# Patient Record
Sex: Male | Born: 1985
Health system: Southern US, Community
[De-identification: ages and names within clinical notes are randomized; demographics above are authoritative.]

## PROBLEM LIST (undated history)

## (undated) DIAGNOSIS — R7301 Impaired fasting glucose: Secondary | ICD-10-CM

## (undated) DIAGNOSIS — G4733 Obstructive sleep apnea (adult) (pediatric): Secondary | ICD-10-CM

## (undated) DIAGNOSIS — E785 Hyperlipidemia, unspecified: Secondary | ICD-10-CM

## (undated) DIAGNOSIS — G40909 Epilepsy, unspecified, not intractable, without status epilepticus: Secondary | ICD-10-CM

## (undated) DIAGNOSIS — J342 Deviated nasal septum: Secondary | ICD-10-CM

## (undated) DIAGNOSIS — K219 Gastro-esophageal reflux disease without esophagitis: Secondary | ICD-10-CM

## (undated) DIAGNOSIS — G43109 Migraine with aura, not intractable, without status migrainosus: Secondary | ICD-10-CM

## (undated) DIAGNOSIS — Z9989 Dependence on other enabling machines and devices: Secondary | ICD-10-CM

## (undated) HISTORY — DX: Obstructive sleep apnea (adult) (pediatric): G47.33

## (undated) HISTORY — DX: Migraine with aura, not intractable, without status migrainosus: G43.109

## (undated) HISTORY — DX: Gastro-esophageal reflux disease without esophagitis: K21.9

## (undated) HISTORY — PX: TONSILLECTOMY: SUR1361

## (undated) HISTORY — DX: Hyperlipidemia, unspecified: E78.5

## (undated) HISTORY — DX: Deviated nasal septum: J34.2

## (undated) HISTORY — DX: Impaired fasting glucose: R73.01

## (undated) HISTORY — DX: Dependence on other enabling machines and devices: Z99.89

## (undated) HISTORY — DX: Epilepsy, unspecified, not intractable, without status epilepticus: G40.909

---

## 2014-05-21 ENCOUNTER — Telehealth: Payer: Self-pay | Admitting: Cardiology

## 2014-05-21 NOTE — Telephone Encounter (Signed)
[facility] for appointment on 05/27/14 with Dr Jens Somrenshaw.  [facility] ([facility]) for Dr Ludwig Clarksrenshaw's schedule on 05/27/14. lp

## 2014-05-27 ENCOUNTER — Encounter: Payer: Self-pay | Admitting: *Deleted

## 2014-05-27 ENCOUNTER — Ambulatory Visit (INDEPENDENT_AMBULATORY_CARE_PROVIDER_SITE_OTHER): Payer: BLUE CROSS/BLUE SHIELD | Admitting: Cardiology

## 2014-05-27 ENCOUNTER — Encounter: Payer: Self-pay | Admitting: Cardiology

## 2014-05-27 VITALS — BP 116/90 | HR 83 | Ht 73.0 in | Wt 262.0 lb

## 2014-05-27 DIAGNOSIS — R0600 Dyspnea, unspecified: Secondary | ICD-10-CM | POA: Insufficient documentation

## 2014-05-27 DIAGNOSIS — R079 Chest pain, unspecified: Secondary | ICD-10-CM

## 2014-05-27 DIAGNOSIS — R072 Precordial pain: Secondary | ICD-10-CM

## 2014-05-27 NOTE — Progress Notes (Signed)
     HPI: 29 year old male for evaluation of chest pain. Laboratories January 2016 showed normal potassium, renal function, liver functions. D-dimer negative. Hemoglobin 16.5. Troponin negative. Chest x-ray negative. Patient states he has had intermittent chest pain for approximately 6-8 months. It is in various locations on his chest. It is described as an ache. No radiation. Not pleuritic, positional, exertional or related to food. Resolves spontaneously. Notes some dyspnea on exertion as well. No orthopnea or PND. No pedal edema or syncope.  Current Outpatient Prescriptions  Medication Sig Dispense Refill  . Biotin 1 MG CAPS Take 1,000 mcg by mouth daily.     . fluticasone (FLONASE) 50 MCG/ACT nasal spray Place 2 sprays into both nostrils daily.     . Magnesium Sulfate 70 MG CAPS Take 1,200 mcg by mouth daily.     . Omega-3 Fatty Acids (FISH OIL) 1000 MG CAPS Take 1 capsule by mouth daily. Takes two to three tablets daily.    Marland Kitchen. topiramate (TOPAMAX) 25 MG capsule Take 25 mg by mouth 2 (two) times daily. Takes 6 tablets daily.     No current facility-administered medications for this visit.    No Known Allergies   Past Medical History  Diagnosis Date  . GERD (gastroesophageal reflux disease)   . Seizure disorder   . OSA (obstructive sleep apnea)     Past Surgical History  Procedure Laterality Date  . Tonsillectomy      History   Social History  . Marital Status: Married    Spouse Name: N/A  . Number of Children: N/A  . 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 Smoking status: Never Smoker   . Smokeless tobacco: Not on file  . Alcohol Use: 0.0 oz/week    0 Standard drinks or equivalent per week     Comment: Occasional  . Drug Use: No  . Sexual Activity: Not on file   Other Topics Concern  . Not on file   Social History Narrative  . No narrative on file    Family History  Problem Relation Age of Onset  .  Hypothyroidism Mother   . Breast cancer Mother     ROS: no fevers or chills, productive cough, hemoptysis, dysphasia, odynophagia, melena, hematochezia, dysuria, hematuria, rash, seizure activity, orthopnea, PND, pedal edema, claudication. Remaining systems are negative.  Physical Exam:   Blood pressure 116/90, pulse 83, height 6\' 1"  (1.854 m), weight 262 lb (118.842 kg).  General:  Well developed/well nourished in NAD Skin warm/dry Patient not depressed No peripheral clubbing Back-normal HEENT-normal/normal eyelids Neck supple/normal carotid upstroke bilaterally; no bruits; no JVD; no thyromegaly chest - CTA/ normal expansion CV - RRR/normal S1 and S2; no murmurs, rubs or gallops;  PMI nondisplaced Abdomen -NT/ND, no HSM, no mass, + bowel sounds, no bruit 2+ femoral pulses, no bruits Ext-no edema, chords, 2+ DP Neuro-grossly nonfocal  ECG sinus rhythm at a rate of 83. No ST changes.

## 2014-05-27 NOTE — Assessment & Plan Note (Signed)
Patient notes dyspnea on exertion. Schedule echocardiogram to assess LV function.

## 2014-05-27 NOTE — Patient Instructions (Signed)
Your physician recommends that you schedule a follow-up appointment in: AS NEEDED PENDING TEST RESULTS  Your physician has requested that you have an echocardiogram. Echocardiography is a painless test that uses sound waves to create images of your heart. It provides your doctor with information about the size and shape of your heart and how well your heart's chambers and valves are working. This procedure takes approximately one hour. There are no restrictions for this procedure.   Your physician has requested that you have an exercise tolerance test. For further information please visit [email or web address]. Please also follow instruction sheet, as given.    Exercise Stress Electrocardiogram An exercise stress electrocardiogram is a test to check how blood flows to your heart. It is done to find areas of poor blood flow. You will need to walk on a treadmill for this test. The electrocardiogram will record your heartbeat when you are at rest and when you are exercising. BEFORE THE PROCEDURE  Do not have drinks with caffeine or foods with caffeine for 24 hours before the test, or as told by your doctor. This includes coffee, tea (even decaf tea), sodas, chocolate, and cocoa.  Follow your doctor's instructions about eating and drinking before the test.  Ask your doctor what medicines you should or should not take before the test. Take your medicines with water unless told by your doctor not to.  If you use an inhaler, bring it with you to the test.  Bring a snack to eat after the test.  Do not  smoke for 4 hours before the test.  Do not put lotions, powders, creams, or oils on your chest before the test.  Wear comfortable shoes and clothing. PROCEDURE  You will have patches put on your chest. Small areas of your chest may need to be shaved. Wires will be connected to the patches.  Your heart rate will be watched while you are resting and while you are exercising.  You will walk on the  treadmill. The treadmill will slowly get faster to raise your heart rate.  The test will take about 1-2 hours. AFTER THE PROCEDURE  Your heart rate and blood pressure will be watched after the test.  You may return to your normal diet, activities, and medicines or as told by your doctor. Document Released: 09/13/2007 Document Revised: 08/11/2013 Document Reviewed: 12/02/2012 ExitCare Patient Information 2015 [employer]. This information is not intended to replace advice given to you by your health care provider. Make sure you discuss any questions you have with your health care provider.   

## 2014-05-27 NOTE — Assessment & Plan Note (Signed)
Symptoms very atypical. However he is concerned about his symptoms and we will therefore proceed with an exercise treadmill for risk stratification.

## 2014-06-19 ENCOUNTER — Telehealth (HOSPITAL_COMMUNITY): Payer: Self-pay

## 2014-06-19 NOTE — Telephone Encounter (Signed)
Encounter complete. 

## 2014-06-23 ENCOUNTER — Telehealth (HOSPITAL_COMMUNITY): Payer: Self-pay

## 2014-06-23 NOTE — Telephone Encounter (Signed)
Encounter complete. 

## 2014-06-24 ENCOUNTER — Ambulatory Visit (HOSPITAL_BASED_OUTPATIENT_CLINIC_OR_DEPARTMENT_OTHER)
Admission: RE | Admit: 2014-06-24 | Discharge: 2014-06-24 | Disposition: A | Discharge status: Home / Self Care | Payer: BLUE CROSS/BLUE SHIELD | Source: Ambulatory Visit | Attending: Cardiology | Admitting: Cardiology

## 2014-06-24 ENCOUNTER — Ambulatory Visit (HOSPITAL_COMMUNITY)
Admission: RE | Admit: 2014-06-24 | Discharge: 2014-06-24 | Disposition: A | Discharge status: Home / Self Care | Payer: BLUE CROSS/BLUE SHIELD | Source: Ambulatory Visit | Attending: Cardiology | Admitting: Cardiology

## 2014-06-24 DIAGNOSIS — R079 Chest pain, unspecified: Secondary | ICD-10-CM | POA: Diagnosis present

## 2014-06-24 NOTE — Procedures (Signed)
Exercise Treadmill Test   Test  Exercise Tolerance Test Ordering MD: Olga MillersBrian Crenshaw, MD  Interpreting MD:   Unique Test No: 1 Treadmill:  1  Indication for ETT: chest pain - rule out ischemia  Contraindication to ETT: No   Stress Modality: exercise - treadmill  Cardiac Imaging Performed: non   Protocol: standard Bruce - maximal  Max BP:  177/84  Max MPHR (bpm):191 85% MPR (bpm):  162  MPHR obtained (bpm):  176 % MPHR obtained:  92  Reached 85% MPHR (min:sec):  8:00 Total Exercise Time (min-sec):  9:42  Workload in METS:  11.10 Borg Scale:   Reason ETT Terminated:  fatigue, dyspnea, light-headedness    ST Segment Analysis At Rest: normal ST segments - no evidence of significant ST depression With Exercise: upsloping <771mm st depression  Other Information Arrhythmia:  No Angina during ETT:  absent (0) Quality of ETT:  diagnostic  ETT Interpretation:  normal - no evidence of ischemia by ST analysis  Comments: Excellent exercise tolerance <1 mm upsloping ST depression No chest pain. Normal BP Response  Chrystie NoseKenneth C. Rahn Lacuesta, MD, [facility] Attending Cardiologist [facility]

## 2014-06-24 NOTE — Progress Notes (Signed)
2D Echocardiogram Complete.  06/24/2014   Goldy Calandra, RDCS  

## 2014-10-21 DIAGNOSIS — E785 Hyperlipidemia, unspecified: Secondary | ICD-10-CM

## 2014-10-21 DIAGNOSIS — R7301 Impaired fasting glucose: Secondary | ICD-10-CM

## 2014-10-21 HISTORY — DX: Hyperlipidemia, unspecified: E78.5

## 2014-10-21 HISTORY — DX: Impaired fasting glucose: R73.01

## 2015-07-22 DIAGNOSIS — G4719 Other hypersomnia: Secondary | ICD-10-CM | POA: Insufficient documentation

## 2015-07-22 DIAGNOSIS — G4733 Obstructive sleep apnea (adult) (pediatric): Secondary | ICD-10-CM

## 2015-07-22 DIAGNOSIS — Z9989 Dependence on other enabling machines and devices: Secondary | ICD-10-CM

## 2015-07-22 DIAGNOSIS — R0683 Snoring: Secondary | ICD-10-CM | POA: Insufficient documentation

## 2015-07-22 DIAGNOSIS — E6609 Other obesity due to excess calories: Secondary | ICD-10-CM | POA: Insufficient documentation

## 2015-07-22 HISTORY — DX: Obstructive sleep apnea (adult) (pediatric): G47.33

## 2015-10-13 NOTE — Progress Notes (Signed)
      HPI: FU chest pain. Laboratories January 2016 showed normal potassium, renal function, liver functions. D-dimer negative. Hemoglobin 16.5. Troponin negative. Chest x-ray negative. Echocardiogram March 2016 showed normal LV systolic function. Exercise treadmill March 2016 was negative. Patient seen recently with chest pain.Chest x-ray negative. Troponin normal. Hemoglobin 15.7. Potassium 4.1. TSH 1.06. Since last seen, He does not typically have dyspnea on exertion, orthopnea, PND, pedal edema, syncope or chest pain. He recently had 2 episodes of palpitations that he was seen in the emergency room for. They're described as sudden onset of heart racing followed by abrupt cessation with dizziness. One occurred after steroid injection.  Current Outpatient Prescriptions  Medication Sig Dispense Refill  . Biotin 1 MG CAPS Take 1,000 mcg by mouth daily.     . fluticasone (FLONASE) 50 MCG/ACT nasal spray Place 2 sprays into both nostrils daily.     . Magnesium Sulfate 70 MG CAPS Take 1,200 mcg by mouth daily.     . Omega-3 Fatty Acids (FISH OIL) 1000 MG CAPS Take 1 capsule by mouth daily. Takes two to three tablets daily.    Marland Kitchen. topiramate (TOPAMAX) 25 MG capsule Take 25 mg by mouth 2 (two) times daily. Takes 6 tablets daily.     No current facility-administered medications for this visit.     Past Medical History  Diagnosis Date  . GERD (gastroesophageal reflux disease)   . Seizure disorder   . OSA (obstructive sleep apnea)     Past Surgical History  Procedure Laterality Date  . Tonsillectomy      Social History   Social History  . Marital Status: Married    Spouse Name: N/A  . Number of Children: N/A  . 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 Smoking status: Never Smoker   . Smokeless tobacco: Not on file  . Alcohol Use: 0.0 oz/week    0 Standard drinks or equivalent per week     Comment: Occasional  . Drug Use: No  .  Sexual Activity: Not on file   Other Topics Concern  . Not on file   Social History Narrative  . No narrative on file    Family History  Problem Relation Age of Onset  . Hypothyroidism Mother   . Breast cancer Mother     ROS: no fevers or chills, productive cough, hemoptysis, dysphasia, odynophagia, melena, hematochezia, dysuria, hematuria, rash, seizure activity, orthopnea, PND, pedal edema, claudication. Remaining systems are negative.  Physical Exam: Well-developed well-nourished in no acute distress.  Skin is warm and dry.  HEENT is normal.  Neck is supple.  Chest is clear to auscultation with normal expansion.  Cardiovascular exam is regular rate and rhythm.  Abdominal exam nontender or distended. No masses palpated. Extremities show no edema. neuro grossly intact  ECG Sinus rhythm at a rate of 60. Nonspecific ST changes.  A/P  1 Palpitations-etiology unclear. Symptoms have been relatively infrequent. If he has more frequent episodes we will place a monitor to exclude significant arrhythmia. Approximately 20 minutes spent reviewing outside records prior to visit. 2 chest pain-no further symptoms. Previous treadmill negative. 3 dyspnea-some dyspnea following recent episodes of palpitations. Echocardiogram showed normal LV function previously. No plans for further evaluation at this point unless symptoms recur.  Olga MillersBrian Donaldson Richter, MD

## 2015-10-15 ENCOUNTER — Telehealth: Payer: Self-pay | Admitting: Cardiology

## 2015-10-15 NOTE — Telephone Encounter (Signed)
[facility] for appointment on 10/18/15 with Dr Jens Somrenshaw.  [facility] for Dr Ludwig Clarksrenshaw's schedule on 10/18/15. lp

## 2015-10-18 ENCOUNTER — Ambulatory Visit (INDEPENDENT_AMBULATORY_CARE_PROVIDER_SITE_OTHER): Payer: BLUE CROSS/BLUE SHIELD | Admitting: Cardiology

## 2015-10-18 ENCOUNTER — Encounter: Payer: Self-pay | Admitting: Cardiology

## 2015-10-18 VITALS — BP 114/76 | HR 60 | Ht 73.0 in | Wt 266.0 lb

## 2015-10-18 DIAGNOSIS — R0600 Dyspnea, unspecified: Secondary | ICD-10-CM | POA: Diagnosis not present

## 2015-10-18 DIAGNOSIS — R072 Precordial pain: Secondary | ICD-10-CM

## 2015-10-18 DIAGNOSIS — R002 Palpitations: Secondary | ICD-10-CM | POA: Diagnosis not present

## 2015-10-18 NOTE — Patient Instructions (Signed)
Your physician wants you to follow-up in: 6 months in [place] with Dr. Jens Somrenshaw. You will receive a reminder letter in the mail two months in advance. If you don't receive a letter, please call our office to schedule the follow-up appointment.   If you need a refill on your cardiac medications before your next appointment, please call your pharmacy.

## 2015-10-21 DIAGNOSIS — Z79899 Other long term (current) drug therapy: Secondary | ICD-10-CM

## 2015-10-21 DIAGNOSIS — Z5181 Encounter for therapeutic drug level monitoring: Secondary | ICD-10-CM | POA: Insufficient documentation

## 2015-10-21 DIAGNOSIS — G40909 Epilepsy, unspecified, not intractable, without status epilepticus: Secondary | ICD-10-CM | POA: Insufficient documentation

## 2015-10-21 DIAGNOSIS — G43109 Migraine with aura, not intractable, without status migrainosus: Secondary | ICD-10-CM

## 2015-10-21 HISTORY — DX: Migraine with aura, not intractable, without status migrainosus: G43.109

## 2016-01-19 DIAGNOSIS — M542 Cervicalgia: Secondary | ICD-10-CM | POA: Insufficient documentation

## 2016-03-20 DIAGNOSIS — G4733 Obstructive sleep apnea (adult) (pediatric): Secondary | ICD-10-CM | POA: Diagnosis not present

## 2016-04-20 DIAGNOSIS — M542 Cervicalgia: Secondary | ICD-10-CM | POA: Diagnosis not present

## 2016-04-20 DIAGNOSIS — G40909 Epilepsy, unspecified, not intractable, without status epilepticus: Secondary | ICD-10-CM | POA: Diagnosis not present

## 2016-04-20 DIAGNOSIS — G43109 Migraine with aura, not intractable, without status migrainosus: Secondary | ICD-10-CM | POA: Diagnosis not present

## 2016-04-20 DIAGNOSIS — Z79899 Other long term (current) drug therapy: Secondary | ICD-10-CM | POA: Diagnosis not present

## 2016-04-20 DIAGNOSIS — Z5181 Encounter for therapeutic drug level monitoring: Secondary | ICD-10-CM | POA: Diagnosis not present

## 2016-05-10 DIAGNOSIS — R293 Abnormal posture: Secondary | ICD-10-CM | POA: Diagnosis not present

## 2016-05-10 DIAGNOSIS — R51 Headache: Secondary | ICD-10-CM | POA: Diagnosis not present

## 2016-05-10 DIAGNOSIS — M542 Cervicalgia: Secondary | ICD-10-CM | POA: Diagnosis not present

## 2016-05-16 DIAGNOSIS — R293 Abnormal posture: Secondary | ICD-10-CM | POA: Diagnosis not present

## 2016-05-16 DIAGNOSIS — R51 Headache: Secondary | ICD-10-CM | POA: Diagnosis not present

## 2016-05-16 DIAGNOSIS — M542 Cervicalgia: Secondary | ICD-10-CM | POA: Diagnosis not present

## 2016-05-25 DIAGNOSIS — M542 Cervicalgia: Secondary | ICD-10-CM | POA: Diagnosis not present

## 2016-05-25 DIAGNOSIS — R51 Headache: Secondary | ICD-10-CM | POA: Diagnosis not present

## 2016-05-25 DIAGNOSIS — R293 Abnormal posture: Secondary | ICD-10-CM | POA: Diagnosis not present

## 2016-06-05 DIAGNOSIS — R293 Abnormal posture: Secondary | ICD-10-CM | POA: Diagnosis not present

## 2016-06-05 DIAGNOSIS — R51 Headache: Secondary | ICD-10-CM | POA: Diagnosis not present

## 2016-06-05 DIAGNOSIS — M542 Cervicalgia: Secondary | ICD-10-CM | POA: Diagnosis not present

## 2016-06-28 DIAGNOSIS — H9202 Otalgia, left ear: Secondary | ICD-10-CM | POA: Diagnosis not present

## 2016-06-28 DIAGNOSIS — G4733 Obstructive sleep apnea (adult) (pediatric): Secondary | ICD-10-CM | POA: Diagnosis not present

## 2016-06-28 DIAGNOSIS — H66005 Acute suppurative otitis media without spontaneous rupture of ear drum, recurrent, left ear: Secondary | ICD-10-CM | POA: Diagnosis not present

## 2016-06-28 DIAGNOSIS — J342 Deviated nasal septum: Secondary | ICD-10-CM | POA: Diagnosis not present

## 2016-07-03 DIAGNOSIS — R293 Abnormal posture: Secondary | ICD-10-CM | POA: Diagnosis not present

## 2016-07-03 DIAGNOSIS — R51 Headache: Secondary | ICD-10-CM | POA: Diagnosis not present

## 2016-07-03 DIAGNOSIS — M542 Cervicalgia: Secondary | ICD-10-CM | POA: Diagnosis not present

## 2016-07-19 DIAGNOSIS — J343 Hypertrophy of nasal turbinates: Secondary | ICD-10-CM | POA: Diagnosis not present

## 2016-07-19 DIAGNOSIS — J342 Deviated nasal septum: Secondary | ICD-10-CM | POA: Diagnosis not present

## 2016-07-19 DIAGNOSIS — J329 Chronic sinusitis, unspecified: Secondary | ICD-10-CM | POA: Diagnosis not present

## 2016-07-19 DIAGNOSIS — J309 Allergic rhinitis, unspecified: Secondary | ICD-10-CM | POA: Diagnosis not present

## 2016-07-24 NOTE — Progress Notes (Deleted)
      HPI: FU chest pain. Laboratories January 2016 showed normal potassium, renal function, liver functions. D-dimer negative. Hemoglobin 16.5. Troponin negative. Chest x-ray negative. Echocardiogram March 2016 showed normal LV systolic function. Exercise treadmill March 2016 was negative. Patient seen recently with chest pain.Chest x-ray negative. Troponin normal. Hemoglobin 15.7. Potassium 4.1. TSH 1.06. Since last seen,   Current Outpatient Prescriptions  Medication Sig Dispense Refill  . fluticasone (FLONASE) 50 MCG/ACT nasal spray Place 2 sprays into both nostrils daily.     . Magnesium Sulfate 70 MG CAPS Take 1,200 mcg by mouth daily.     . Omega-3 Fatty Acids (FISH OIL) 1000 MG CAPS Take 1 capsule by mouth daily. Takes two to three tablets daily.    Marland Kitchen topiramate (TOPAMAX) 25 MG capsule Take 25 mg by mouth 2 (two) times daily. Takes 6 tablets daily.     No current facility-administered medications for this visit.      Past Medical History:  Diagnosis Date  . GERD (gastroesophageal reflux disease)   . OSA (obstructive sleep apnea)   . Seizure disorder [facility])     Past Surgical History:  Procedure Laterality Date  . TONSILLECTOMY      Social History   Social History  . Marital status: Married    Spouse name: N/A  . Number of children: N/A  . 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 Smoking status: Never Smoker  . Smokeless tobacco: Not on file  . Alcohol use 0.0 oz/week     Comment: Occasional  . Drug use: No  . Sexual activity: Not on file   Other Topics Concern  . Not on file   Social History Narrative  . No narrative on file    Family History  Problem Relation Age of Onset  . Hypothyroidism Mother   . Breast cancer Mother     ROS: no fevers or chills, productive cough, hemoptysis, dysphasia, odynophagia, melena, hematochezia, dysuria, hematuria, rash, seizure activity, orthopnea, PND, pedal edema,  claudication. Remaining systems are negative.  Physical Exam: Well-developed well-nourished in no acute distress.  Skin is warm and dry.  HEENT is normal.  Neck is supple.  Chest is clear to auscultation with normal expansion.  Cardiovascular exam is regular rate and rhythm.  Abdominal exam nontender or distended. No masses palpated. Extremities show no edema. neuro grossly intact  ECG- personally reviewed  A/P  1  Olga Millers, MD

## 2016-07-28 DIAGNOSIS — G4733 Obstructive sleep apnea (adult) (pediatric): Secondary | ICD-10-CM | POA: Diagnosis not present

## 2016-08-02 ENCOUNTER — Ambulatory Visit: Payer: BLUE CROSS/BLUE SHIELD | Admitting: Cardiology

## 2016-08-07 DIAGNOSIS — M542 Cervicalgia: Secondary | ICD-10-CM | POA: Diagnosis not present

## 2016-08-07 DIAGNOSIS — G43109 Migraine with aura, not intractable, without status migrainosus: Secondary | ICD-10-CM | POA: Diagnosis not present

## 2016-08-07 DIAGNOSIS — G40909 Epilepsy, unspecified, not intractable, without status epilepticus: Secondary | ICD-10-CM | POA: Diagnosis not present

## 2016-08-07 DIAGNOSIS — Z5181 Encounter for therapeutic drug level monitoring: Secondary | ICD-10-CM | POA: Diagnosis not present

## 2016-08-16 NOTE — Progress Notes (Deleted)
      HPI: FU chest pain. Echocardiogram March 2016 showed normal LV systolic function. Exercise treadmill March 2016 was negative. Since last seen,   Current Outpatient Prescriptions  Medication Sig Dispense Refill  . fluticasone (FLONASE) 50 MCG/ACT nasal spray Place 2 sprays into both nostrils daily.     . Magnesium Sulfate 70 MG CAPS Take 1,200 mcg by mouth daily.     . Omega-3 Fatty Acids (FISH OIL) 1000 MG CAPS Take 1 capsule by mouth daily. Takes two to three tablets daily.    Marland Kitchen. topiramate (TOPAMAX) 25 MG capsule Take 25 mg by mouth 2 (two) times daily. Takes 6 tablets daily.     No current facility-administered medications for this visit.      Past Medical History:  Diagnosis Date  . GERD (gastroesophageal reflux disease)   . OSA (obstructive sleep apnea)   . Seizure disorder [facility](HCC)     Past Surgical History:  Procedure Laterality Date  . TONSILLECTOMY      Social History   Social History  . Marital status: Married    Spouse name: N/A  . Number of children: N/A  . 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 Smoking status: Never Smoker  . Smokeless tobacco: Not on file  . Alcohol use 0.0 oz/week     Comment: Occasional  . Drug use: No  . Sexual activity: Not on file   Other Topics Concern  . Not on file   Social History Narrative  . No narrative on file    Family History  Problem Relation Age of Onset  . Hypothyroidism Mother   . Breast cancer Mother     ROS: no fevers or chills, productive cough, hemoptysis, dysphasia, odynophagia, melena, hematochezia, dysuria, hematuria, rash, seizure activity, orthopnea, PND, pedal edema, claudication. Remaining systems are negative.  Physical Exam: Well-developed well-nourished in no acute distress.  Skin is warm and dry.  HEENT is normal.  Neck is supple.  Chest is clear to auscultation with normal expansion.  Cardiovascular exam is regular rate and  rhythm.  Abdominal exam nontender or distended. No masses palpated. Extremities show no edema. neuro grossly intact  ECG- personally reviewed  A/P  1  Benjamin MillersBrian Ryane Canavan, MD

## 2016-08-23 ENCOUNTER — Ambulatory Visit: Payer: BLUE CROSS/BLUE SHIELD | Admitting: Cardiology

## 2016-09-08 DIAGNOSIS — J301 Allergic rhinitis due to pollen: Secondary | ICD-10-CM | POA: Diagnosis not present

## 2016-09-08 DIAGNOSIS — J328 Other chronic sinusitis: Secondary | ICD-10-CM | POA: Diagnosis not present

## 2016-10-26 NOTE — Progress Notes (Deleted)
      HPI: FU chest pain. Echocardiogram March 2016 showed normal LV systolic function. Exercise treadmill March 2016 was negative. Since last seen,   Current Outpatient Prescriptions  Medication Sig Dispense Refill  . fluticasone (FLONASE) 50 MCG/ACT nasal spray Place 2 sprays into both nostrils daily.     . Magnesium Sulfate 70 MG CAPS Take 1,200 mcg by mouth daily.     . Omega-3 Fatty Acids (FISH OIL) 1000 MG CAPS Take 1 capsule by mouth daily. Takes two to three tablets daily.    Marland Kitchen. topiramate (TOPAMAX) 25 MG capsule Take 25 mg by mouth 2 (two) times daily. Takes 6 tablets daily.     No current facility-administered medications for this visit.      Past Medical History:  Diagnosis Date  . GERD (gastroesophageal reflux disease)   . OSA (obstructive sleep apnea)   . Seizure disorder [facility](HCC)     Past Surgical History:  Procedure Laterality Date  . TONSILLECTOMY      Social History   Social History  . Marital status: Married    Spouse name: N/A  . Number of children: N/A  . 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 Smoking status: Never Smoker  . Smokeless tobacco: Not on file  . Alcohol use 0.0 oz/week     Comment: Occasional  . Drug use: No  . Sexual activity: Not on file   Other Topics Concern  . Not on file   Social History Narrative  . No narrative on file    Family History  Problem Relation Age of Onset  . Hypothyroidism Mother   . Breast cancer Mother     ROS: no fevers or chills, productive cough, hemoptysis, dysphasia, odynophagia, melena, hematochezia, dysuria, hematuria, rash, seizure activity, orthopnea, PND, pedal edema, claudication. Remaining systems are negative.  Physical Exam: Well-developed well-nourished in no acute distress.  Skin is warm and dry.  HEENT is normal.  Neck is supple.  Chest is clear to auscultation with normal expansion.  Cardiovascular exam is regular rate and  rhythm.  Abdominal exam nontender or distended. No masses palpated. Extremities show no edema. neuro grossly intact  ECG- personally reviewed  A/P  1  Olga MillersBrian Mio Schellinger, MD

## 2016-11-08 ENCOUNTER — Ambulatory Visit: Payer: BLUE CROSS/BLUE SHIELD | Admitting: Cardiology

## 2016-12-01 DIAGNOSIS — D582 Other hemoglobinopathies: Secondary | ICD-10-CM | POA: Diagnosis not present

## 2016-12-01 DIAGNOSIS — Z0000 Encounter for general adult medical examination without abnormal findings: Secondary | ICD-10-CM | POA: Diagnosis not present

## 2016-12-01 DIAGNOSIS — E785 Hyperlipidemia, unspecified: Secondary | ICD-10-CM | POA: Diagnosis not present

## 2016-12-01 DIAGNOSIS — R7301 Impaired fasting glucose: Secondary | ICD-10-CM | POA: Diagnosis not present

## 2017-01-26 DIAGNOSIS — M542 Cervicalgia: Secondary | ICD-10-CM | POA: Diagnosis not present

## 2017-01-26 DIAGNOSIS — M545 Low back pain: Secondary | ICD-10-CM | POA: Diagnosis not present

## 2017-02-13 NOTE — Progress Notes (Deleted)
      HPI: FU chest pain. Echocardiogram March 2016 showed normal LV systolic function. Exercise treadmill March 2016 was negative. Since last seen,   Current Outpatient Medications  Medication Sig Dispense Refill  . fluticasone (FLONASE) 50 MCG/ACT nasal spray Place 2 sprays into both nostrils daily.     . Magnesium Sulfate 70 MG CAPS Take 1,200 mcg by mouth daily.     . Omega-3 Fatty Acids (FISH OIL) 1000 MG CAPS Take 1 capsule by mouth daily. Takes two to three tablets daily.    Marland Kitchen. topiramate (TOPAMAX) 25 MG capsule Take 25 mg by mouth 2 (two) times daily. Takes 6 tablets daily.     No current facility-administered medications for this visit.      Past Medical History:  Diagnosis Date  . GERD (gastroesophageal reflux disease)   . OSA (obstructive sleep apnea)   . Seizure disorder [facility](HCC)     Past Surgical History:  Procedure Laterality Date  . TONSILLECTOMY      Social History   Socioeconomic History  . Marital status: Married    Spouse name: Not on file  . Number of children: Not on file  . Years of education: Not on file  . Highest education level: Not on file  Social Needs  . Financial resource strain: Not on file  . Food insecurity - worry: Not on file  . Food insecurity - inability: Not on file  . Transportation needs - medical: Not on file  . Transportation needs - non-medical: Not on file  Occupational History    Comment: IT  Tobacco Use  . Smoking status: Never Smoker  Substance and Sexual Activity  . Alcohol use: Yes    Alcohol/week: 0.0 oz    Comment: Occasional  . Drug use: No  . Sexual activity: Not on file  Other Topics Concern  . Not on file  Social History Narrative  . Not on file    Family History  Problem Relation Age of Onset  . Hypothyroidism Mother   . Breast cancer Mother     ROS: no fevers or chills, productive cough, hemoptysis, dysphasia, odynophagia, melena, hematochezia, dysuria, hematuria, rash, seizure activity, orthopnea,  PND, pedal edema, claudication. Remaining systems are negative.  Physical Exam: Well-developed well-nourished in no acute distress.  Skin is warm and dry.  HEENT is normal.  Neck is supple.  Chest is clear to auscultation with normal expansion.  Cardiovascular exam is regular rate and rhythm.  Abdominal exam nontender or distended. No masses palpated. Extremities show no edema. neuro grossly intact  ECG- personally reviewed  A/P  1  Benjamin MillersBrian Crenshaw, MD

## 2017-02-21 ENCOUNTER — Ambulatory Visit: Payer: BLUE CROSS/BLUE SHIELD | Admitting: Cardiology

## 2017-04-06 DIAGNOSIS — G4733 Obstructive sleep apnea (adult) (pediatric): Secondary | ICD-10-CM | POA: Diagnosis not present

## 2017-05-07 DIAGNOSIS — J019 Acute sinusitis, unspecified: Secondary | ICD-10-CM | POA: Diagnosis not present

## 2017-05-08 NOTE — Progress Notes (Signed)
      HPI: FU chest pain. Echocardiogram March 2016 showed normal LV systolic function. Exercise treadmill March 2016 was negative. Since last seen, the patient has dyspnea with more extreme activities but not with routine activities. It is relieved with rest. It is not associated with chest pain. There is no orthopnea, PND or pedal edema. There is no syncope. There is no exertional chest pain. Occasional palpitations.   Current Outpatient Medications  Medication Sig Dispense Refill  . fluticasone (FLONASE) 50 MCG/ACT nasal spray Place 2 sprays into both nostrils daily.     . Magnesium Sulfate 70 MG CAPS Take 1,200 mcg by mouth daily.     . Omega-3 Fatty Acids (FISH OIL) 1000 MG CAPS Take 1 capsule by mouth daily. Takes two to three tablets daily.    Marland Kitchen. topiramate (TOPAMAX) 25 MG capsule Take 25 mg by mouth 2 (two) times daily. Takes 6 tablets daily.     No current facility-administered medications for this visit.      Past Medical History:  Diagnosis Date  . GERD (gastroesophageal reflux disease)   . OSA (obstructive sleep apnea)   . Seizure disorder [facility](HCC)     Past Surgical History:  Procedure Laterality Date  . TONSILLECTOMY      Social History   Socioeconomic History  . Marital status: Married    Spouse name: Not on file  . Number of children: Not on file  . Years of education: Not on file  . Highest education level: Not on file  Social Needs  . Financial resource strain: Not on file  . Food insecurity - worry: Not on file  . Food insecurity - inability: Not on file  . Transportation needs - medical: Not on file  . Transportation needs - non-medical: Not on file  Occupational History    Comment: IT  Tobacco Use  . Smoking status: Never Smoker  . Smokeless tobacco: Never Used  Substance and Sexual Activity  . Alcohol use: Yes    Alcohol/week: 0.0 oz    Comment: Occasional  . Drug use: No  . Sexual activity: Not on file  Other Topics Concern  . Not on file    Social History Narrative  . Not on file    Family History  Problem Relation Age of Onset  . Hypothyroidism Mother   . Breast cancer Mother     ROS: no fevers or chills, productive cough, hemoptysis, dysphasia, odynophagia, melena, hematochezia, dysuria, hematuria, rash, seizure activity, orthopnea, PND, pedal edema, claudication. Remaining systems are negative.  Physical Exam: Well-developed well-nourished in no acute distress.  Skin is warm and dry.  HEENT is normal.  Neck is supple.  Chest is clear to auscultation with normal expansion.  Cardiovascular exam is regular rate and rhythm.  Abdominal exam nontender or distended. No masses palpated. Extremities show no edema. neuro grossly intact  ECG-sinus rhythm at a rate of 66.  Nonspecific ST changes.  Personally reviewed  A/P  1 chest pain-symptoms atypical previously; no recurrence.  Exercise treadmill negative.  We will not pursue further cardiac evaluation.  2 palpitations-symptoms are reasonably well controlled.  We will consider a monitor in the future if they worsen.  3 dyspnea-previous echocardiogram showed normal LV function.  No further evaluation.  Olga MillersBrian Jaquavius Hudler, MD

## 2017-05-16 ENCOUNTER — Encounter: Payer: Self-pay | Admitting: Cardiology

## 2017-05-16 ENCOUNTER — Ambulatory Visit (INDEPENDENT_AMBULATORY_CARE_PROVIDER_SITE_OTHER): Payer: BLUE CROSS/BLUE SHIELD | Admitting: Cardiology

## 2017-05-16 VITALS — BP 118/82 | HR 66 | Ht 73.0 in | Wt 258.1 lb

## 2017-05-16 DIAGNOSIS — R0600 Dyspnea, unspecified: Secondary | ICD-10-CM | POA: Diagnosis not present

## 2017-05-16 DIAGNOSIS — R072 Precordial pain: Secondary | ICD-10-CM

## 2017-05-16 DIAGNOSIS — R002 Palpitations: Secondary | ICD-10-CM | POA: Diagnosis not present

## 2017-05-16 NOTE — Patient Instructions (Signed)
Your physician wants you to follow-up in: ONE YEAR WITH DR CRENSHAW You will receive a reminder letter in the mail two months in advance. If you don't receive a letter, please call our office to schedule the follow-up appointment.   If you need a refill on your cardiac medications before your next appointment, please call your pharmacy.  

## 2017-05-24 DIAGNOSIS — R6889 Other general symptoms and signs: Secondary | ICD-10-CM | POA: Diagnosis not present

## 2017-05-24 DIAGNOSIS — J329 Chronic sinusitis, unspecified: Secondary | ICD-10-CM | POA: Diagnosis not present

## 2017-10-10 ENCOUNTER — Ambulatory Visit: Payer: BLUE CROSS/BLUE SHIELD | Admitting: Physician Assistant

## 2017-10-17 ENCOUNTER — Encounter: Payer: Self-pay | Admitting: Physician Assistant

## 2017-10-17 ENCOUNTER — Ambulatory Visit: Payer: BLUE CROSS/BLUE SHIELD | Admitting: Physician Assistant

## 2017-10-17 ENCOUNTER — Encounter (INDEPENDENT_AMBULATORY_CARE_PROVIDER_SITE_OTHER): Payer: Self-pay

## 2017-10-17 VITALS — BP 120/86 | HR 77 | Ht 73.0 in | Wt 256.0 lb

## 2017-10-17 DIAGNOSIS — E6609 Other obesity due to excess calories: Secondary | ICD-10-CM

## 2017-10-17 DIAGNOSIS — R1032 Left lower quadrant pain: Secondary | ICD-10-CM | POA: Diagnosis not present

## 2017-10-17 DIAGNOSIS — Z7689 Persons encountering health services in other specified circumstances: Secondary | ICD-10-CM

## 2017-10-17 DIAGNOSIS — E785 Hyperlipidemia, unspecified: Secondary | ICD-10-CM

## 2017-10-17 DIAGNOSIS — J342 Deviated nasal septum: Secondary | ICD-10-CM

## 2017-10-17 MED ORDER — DICYCLOMINE HCL 10 MG PO CAPS: 10.0000 mg | ORAL_CAPSULE | Freq: Three times a day (TID) | ORAL | 0 refills | Status: DC

## 2017-10-17 NOTE — Patient Instructions (Signed)
Diet for Irritable Bowel Syndrome When you have irritable bowel syndrome (IBS), the foods you eat and your eating habits are very important. IBS may cause various symptoms, such as abdominal pain, constipation, or diarrhea. Choosing the right foods can help ease discomfort caused by these symptoms. Work with your health care provider and dietitian to find the best eating plan to help control your symptoms. What general guidelines do I need to follow?  Keep a food diary. This will help you identify foods that cause symptoms. Write down: ? What you eat and when. ? What symptoms you have. ? When symptoms occur in relation to your meals.  Avoid foods that cause symptoms. Talk with your dietitian about other ways to get the same nutrients that are in these foods.  Eat more foods that contain fiber. Take a fiber supplement if directed by your dietitian.  Eat your meals slowly, in a relaxed setting.  Aim to eat 5-6 small meals per day. Do not skip meals.  Drink enough fluids to keep your urine clear or pale yellow.  Ask your health care provider if you should take an over-the-counter probiotic during flare-ups to help restore healthy gut bacteria.  If you have cramping or diarrhea, try making your meals low in fat and high in carbohydrates. Examples of carbohydrates are pasta, rice, whole grain breads and cereals, fruits, and vegetables.  If dairy products cause your symptoms to flare up, try eating less of them. You might be able to handle yogurt better than other dairy products because it contains bacteria that help with digestion. What foods are not recommended? The following are some foods and drinks that may worsen your symptoms:  Fatty foods, such as French fries.  Milk products, such as cheese or ice cream.  Chocolate.  Alcohol.  Products with caffeine, such as coffee.  Carbonated drinks, such as soda.  The items listed above may not be a complete list of foods and beverages to  avoid. Contact your dietitian for more information. What foods are good sources of fiber? Your health care provider or dietitian may recommend that you eat more foods that contain fiber. Fiber can help reduce constipation and other IBS symptoms. Add foods with fiber to your diet a little at a time so that your body can get used to them. Too much fiber at once might cause gas and swelling of your abdomen. The following are some foods that are good sources of fiber:  Apples.  Peaches.  Pears.  Berries.  Figs.  Broccoli (raw).  Cabbage.  Carrots.  Raw peas.  Kidney beans.  Lima beans.  Whole grain bread.  Whole grain cereal.  Where to find more information: International Foundation for Functional Gastrointestinal Disorders: www.iffgd.org National Institute of Diabetes and Digestive and Kidney Diseases: www.niddk.nih.gov/health-information/health-topics/digestive-diseases/ibs/Pages/facts.aspx This information is not intended to replace advice given to you by your health care provider. Make sure you discuss any questions you have with your health care provider. Document Released: 06/17/2003 Document Revised: 09/02/2015 Document Reviewed: 06/27/2013 Elsevier Interactive Patient Education  2018 Elsevier Inc.  

## 2017-10-17 NOTE — Progress Notes (Signed)
HPI:                                                                Benjamin Mendoza is a 32 y.o. male who [facility] Primary Care Sports Medicine today to establish care  Current concerns: abdominal pain  Abdominal Pain  This is a recurrent problem. The current episode started more than 1 month ago (x 6 months). The problem occurs daily. The problem has been unchanged. The pain is located in the LLQ. The quality of the pain is colicky and sharp. The abdominal pain does not radiate. Associated symptoms include constipation, flatus and nausea. Pertinent negatives include no diarrhea, dysuria, hematochezia, hematuria, melena or vomiting. Associated symptoms comments: + bloating. The pain is aggravated by eating (+ stress, greasy foods). The pain is relieved by nothing. Treatments tried: probiotic, dietary changes. The treatment provided moderate relief. There is no history of abdominal surgery.     Depression screen PHQ 2/9 10/17/2017  Decreased Interest 0  Down, Depressed, Hopeless 1  PHQ - 2 Score 1  Altered sleeping 0  Tired, decreased energy 1  Change in appetite 0  Feeling bad or failure about yourself  0  Trouble concentrating 0  Moving slowly or fidgety/restless 0  Suicidal thoughts 0  PHQ-9 Score 2  Difficult doing work/chores Not difficult at all    GAD 7 : Generalized Anxiety Score 10/17/2017  Nervous, Anxious, on Edge 1  Control/stop worrying 0  Worry too much - different things 1  Trouble relaxing 2  Restless 1  Easily annoyed or irritable 2  Afraid - awful might happen 0  Total GAD 7 Score 7      Past Medical History:  Diagnosis Date  . Deviated nasal septum   . Dyslipidemia 10/21/2014  . GERD (gastroesophageal reflux disease)   . IFG (impaired fasting glucose) 10/21/2014  . Migraine with visual aura 10/21/2015  . OSA (obstructive sleep apnea)   . OSA on CPAP 07/22/2015   Overview:  Followed [facility]  .  Seizure disorder [facility](HCC)    Past Surgical History:  Procedure Laterality Date  . TONSILLECTOMY     Social History   Tobacco Use  . Smoking status: Never Smoker  . Smokeless tobacco: Never Used  Substance Use Topics  . Alcohol use: Yes    Alcohol/week: 2.4 oz    Types: 4 Standard drinks or equivalent per week   family history includes Breast cancer in his mother; Hypothyroidism in his mother.    ROS: Review of Systems  HENT: Positive for sinus pain.   Gastrointestinal: Positive for abdominal pain, constipation, flatus and nausea. Negative for diarrhea, hematochezia, melena and vomiting.  Genitourinary: Negative for dysuria and hematuria.  Musculoskeletal: Positive for joint pain and neck pain.  Skin:       + new lumps     Medications: Current Outpatient Medications  Medication Sig Dispense Refill  . Bromelains (BROMELAIN PO) Take by mouth.    . Magnesium Sulfate 70 MG CAPS Take 1,200 mcg by mouth daily.     . Omega-3 Fatty Acids (FISH OIL) 1000 MG CAPS Take 1 capsule by mouth daily. Takes two to three tablets daily.    . Probiotic Product (PROBIOTIC ADVANCED) CAPS Take by  mouth.    . dicyclomine (BENTYL) 10 MG capsule Take 1 capsule (10 mg total) by mouth 4 (four) times daily -  before meals and at bedtime. 90 capsule 0  . topiramate (TOPAMAX) 50 MG tablet Take 200 mg by mouth at bedtime.  3   No current facility-administered medications for this visit.    Allergies  Allergen Reactions  . Metoclopramide Palpitations            Objective:  BP 120/86   Pulse 77   Ht 6\' 1"  (1.854 m)   Wt 256 lb (116.1 kg)   SpO2 94%   BMI 33.78 kg/m  Gen:  alert, not ill-appearing, no distress, appropriate for age, obese male HEENT: head normocephalic without obvious abnormality, conjunctiva and cornea clear, moderate amount of cerumen in bilateral ear canals, TM appears intact, deviated nasal septum and right nasal turbinate is edematous, neck supple, trachea midline Pulm:  Normal work of breathing, normal phonation, clear to auscultation bilaterally, no wheezes, rales or rhonchi CV: Normal rate, regular rhythm, s1 and s2 distinct, no murmurs, clicks or rubs  GI: abdomen soft, non-tender, no guarding or rigidity, no palpable masses Neuro: alert and oriented x 3, no tremor MSK: extremities atraumatic, normal gait and station Skin: intact, no rashes on exposed skin, no jaundice, no cyanosis Psych: well-groomed, cooperative, good eye contact, euthymic mood, affect mood-congruent, speech is articulate, and thought processes clear and goal-directed    No results found for this or any previous visit (from the past 72 hour(s)). No results found.    Assessment and Plan: 32 y.o. male with   Encounter to establish care  Colicky LLQ abdominal pain - Plan: Urinalysis, Routine w reflex microscopic, US Abdomen Complete, CBC with Differential/Platelet, Lipase, Comprehensive metabolic panel, Urine Culture, dicyclomine (BENTYL) 10 MG capsule  Dyslipidemia - Plan: Lipid Panel w/reflex Direct LDL  Class 1 obesity due to excess calories in adult, unspecified BMI, unspecified whether serious comorbidity present  Deviated nasal septum  - Personally reviewed PMH, PSH, PFH, medications, allergies, HM - Age-appropriate cancer screening: n/a - Influenza n/a - Tdap UTD - PHQ2 negative  Colicky LLQ - 6 months of persistent LLQ abdominal pain. Benign exam.  Symptoms are worsened by fatty/fried foods. Assessing for biliary tree pathology with [place] and CMP. Differential is broad so also checking UA, urine culture, lipase and CBC. No red flag symptoms (weight loss, constitutional symptoms, melena/hematochezia). Functional abdominal pain is also on the differential. Encouraged IBS diet and trial of Dicyclomine   Patient education and anticipatory guidance given Patient agrees with treatment plan Follow-up in 2 weeks or sooner as needed if symptoms worsen or fail to  improve  Levonne Hubert PA-C

## 2017-10-26 DIAGNOSIS — R1032 Left lower quadrant pain: Secondary | ICD-10-CM | POA: Diagnosis not present

## 2017-10-26 DIAGNOSIS — J343 Hypertrophy of nasal turbinates: Secondary | ICD-10-CM | POA: Diagnosis not present

## 2017-10-26 DIAGNOSIS — J342 Deviated nasal septum: Secondary | ICD-10-CM | POA: Diagnosis not present

## 2017-10-26 DIAGNOSIS — J329 Chronic sinusitis, unspecified: Secondary | ICD-10-CM | POA: Diagnosis not present

## 2017-10-26 DIAGNOSIS — E785 Hyperlipidemia, unspecified: Secondary | ICD-10-CM | POA: Diagnosis not present

## 2017-10-26 DIAGNOSIS — R110 Nausea: Secondary | ICD-10-CM | POA: Diagnosis not present

## 2017-10-27 LAB — CBC WITH DIFFERENTIAL/PLATELET
BASOS PCT: 0.4 %
Basophils Absolute: 27 cells/uL (ref 0–200)
Eosinophils Absolute: 107 cells/uL (ref 15–500)
Eosinophils Relative: 1.6 %
HEMATOCRIT: 47.6 % (ref 38.5–50.0)
Hemoglobin: 16.1 g/dL (ref 13.2–17.1)
Lymphs Abs: 3142 cells/uL (ref 850–3900)
MCH: 30.7 pg (ref 27.0–33.0)
MCHC: 33.8 g/dL (ref 32.0–36.0)
MCV: 90.7 fL (ref 80.0–100.0)
MPV: 10.2 fL (ref 7.5–12.5)
Monocytes Relative: 7.9 %
NEUTROS ABS: 2894 {cells}/uL (ref 1500–7800)
Neutrophils Relative %: 43.2 %
Platelets: 247 10*3/uL (ref 140–400)
RBC: 5.25 10*6/uL (ref 4.20–5.80)
RDW: 12.3 % (ref 11.0–15.0)
Total Lymphocyte: 46.9 %
WBC: 6.7 10*3/uL (ref 3.8–10.8)
WBCMIX: 529 {cells}/uL (ref 200–950)

## 2017-10-27 LAB — COMPREHENSIVE METABOLIC PANEL
AG Ratio: 1.9 (calc) (ref 1.0–2.5)
ALT: 19 U/L (ref 9–46)
AST: 15 U/L (ref 10–40)
Albumin: 4.6 g/dL (ref 3.6–5.1)
Alkaline phosphatase (APISO): 37 U/L — ABNORMAL LOW (ref 40–115)
BILIRUBIN TOTAL: 0.6 mg/dL (ref 0.2–1.2)
BUN: 17 mg/dL (ref 7–25)
CO2: 24 mmol/L (ref 20–32)
Calcium: 9.6 mg/dL (ref 8.6–10.3)
Chloride: 109 mmol/L (ref 98–110)
Creat: 1.19 mg/dL (ref 0.60–1.35)
Globulin: 2.4 g/dL (calc) (ref 1.9–3.7)
Glucose, Bld: 98 mg/dL (ref 65–139)
Potassium: 4.1 mmol/L (ref 3.5–5.3)
SODIUM: 142 mmol/L (ref 135–146)
Total Protein: 7 g/dL (ref 6.1–8.1)

## 2017-10-27 LAB — URINE CULTURE
MICRO NUMBER: 90859213
Result:: NO GROWTH
SPECIMEN QUALITY:: ADEQUATE

## 2017-10-27 LAB — URINALYSIS, ROUTINE W REFLEX MICROSCOPIC
Bilirubin Urine: NEGATIVE
Glucose, UA: NEGATIVE
Hgb urine dipstick: NEGATIVE
KETONES UR: NEGATIVE
Leukocytes, UA: NEGATIVE
NITRITE: NEGATIVE
Protein, ur: NEGATIVE
SPECIFIC GRAVITY, URINE: 1.01 (ref 1.001–1.03)
pH: 6.5 (ref 5.0–8.0)

## 2017-10-27 LAB — LIPID PANEL W/REFLEX DIRECT LDL
CHOL/HDL RATIO: 3.9 (calc) (ref <5.0)
Cholesterol: 189 mg/dL (ref <200)
HDL: 48 mg/dL (ref >40)
LDL CHOLESTEROL (CALC): 106 mg/dL — AB
Non-HDL Cholesterol (Calc): 141 mg/dL (calc) — ABNORMAL HIGH (ref <130)
Triglycerides: 233 mg/dL — ABNORMAL HIGH (ref <150)

## 2017-10-27 LAB — LIPASE: Lipase: 38 U/L (ref 7–60)

## 2017-10-30 ENCOUNTER — Encounter: Payer: Self-pay | Admitting: Physician Assistant

## 2017-10-30 DIAGNOSIS — E781 Pure hyperglyceridemia: Secondary | ICD-10-CM | POA: Insufficient documentation

## 2017-10-30 NOTE — Progress Notes (Signed)
No lab abnormalities to explain abdominal pain Triglycerides are a little increased. Recommend low-fat Mediterranean diet, regular aerobic exercise and losing 5% of current body weight Still recommend abdominal ultrasound Continue trial of Bentyl and follow-up

## 2017-10-31 ENCOUNTER — Ambulatory Visit: Payer: BLUE CROSS/BLUE SHIELD | Admitting: Physician Assistant

## 2017-11-26 ENCOUNTER — Other Ambulatory Visit: Payer: Self-pay | Admitting: Physician Assistant

## 2017-11-26 DIAGNOSIS — R1032 Left lower quadrant pain: Secondary | ICD-10-CM

## 2017-12-04 DIAGNOSIS — J320 Chronic maxillary sinusitis: Secondary | ICD-10-CM | POA: Diagnosis not present

## 2017-12-04 DIAGNOSIS — J343 Hypertrophy of nasal turbinates: Secondary | ICD-10-CM | POA: Diagnosis not present

## 2018-01-23 DIAGNOSIS — J069 Acute upper respiratory infection, unspecified: Secondary | ICD-10-CM | POA: Diagnosis not present

## 2018-02-12 DIAGNOSIS — S238XXA Sprain of other specified parts of thorax, initial encounter: Secondary | ICD-10-CM | POA: Diagnosis not present

## 2018-02-12 DIAGNOSIS — S161XXA Strain of muscle, fascia and tendon at neck level, initial encounter: Secondary | ICD-10-CM | POA: Diagnosis not present

## 2018-02-12 DIAGNOSIS — S134XXA Sprain of ligaments of cervical spine, initial encounter: Secondary | ICD-10-CM | POA: Diagnosis not present

## 2018-02-12 DIAGNOSIS — M9901 Segmental and somatic dysfunction of cervical region: Secondary | ICD-10-CM | POA: Diagnosis not present

## 2018-03-11 DIAGNOSIS — G4733 Obstructive sleep apnea (adult) (pediatric): Secondary | ICD-10-CM | POA: Diagnosis not present

## 2019-01-06 DIAGNOSIS — J0110 Acute frontal sinusitis, unspecified: Secondary | ICD-10-CM | POA: Diagnosis not present

## 2019-01-14 DIAGNOSIS — M9905 Segmental and somatic dysfunction of pelvic region: Secondary | ICD-10-CM | POA: Diagnosis not present

## 2019-01-14 DIAGNOSIS — S39012A Strain of muscle, fascia and tendon of lower back, initial encounter: Secondary | ICD-10-CM | POA: Diagnosis not present

## 2019-01-14 DIAGNOSIS — M25551 Pain in right hip: Secondary | ICD-10-CM | POA: Diagnosis not present

## 2019-01-14 DIAGNOSIS — M9903 Segmental and somatic dysfunction of lumbar region: Secondary | ICD-10-CM | POA: Diagnosis not present

## 2019-01-23 DIAGNOSIS — Z9989 Dependence on other enabling machines and devices: Secondary | ICD-10-CM | POA: Diagnosis not present

## 2019-01-23 DIAGNOSIS — J0191 Acute recurrent sinusitis, unspecified: Secondary | ICD-10-CM | POA: Diagnosis not present

## 2019-01-23 DIAGNOSIS — G4733 Obstructive sleep apnea (adult) (pediatric): Secondary | ICD-10-CM | POA: Diagnosis not present

## 2019-02-05 DIAGNOSIS — J329 Chronic sinusitis, unspecified: Secondary | ICD-10-CM | POA: Diagnosis not present

## 2019-02-05 DIAGNOSIS — H6123 Impacted cerumen, bilateral: Secondary | ICD-10-CM | POA: Diagnosis not present

## 2019-03-25 ENCOUNTER — Telehealth: Payer: Self-pay | Admitting: Physician Assistant

## 2019-03-25 NOTE — Telephone Encounter (Signed)
Patient called in and was having some cold sensation in his left foot and felt like a charley horse in his calf for about a week. He has a follow up with Dr. Darene Lamer on 03/27/2019. Patient was advised if he had some pain to go to UC or ED if symptoms and he did not have any questions.

## 2019-03-27 ENCOUNTER — Other Ambulatory Visit: Payer: Self-pay

## 2019-03-27 ENCOUNTER — Ambulatory Visit (INDEPENDENT_AMBULATORY_CARE_PROVIDER_SITE_OTHER): Payer: BC Managed Care – PPO | Admitting: Sports Medicine

## 2019-03-27 ENCOUNTER — Ambulatory Visit (INDEPENDENT_AMBULATORY_CARE_PROVIDER_SITE_OTHER): Payer: BC Managed Care – PPO

## 2019-03-27 ENCOUNTER — Encounter: Payer: Self-pay | Admitting: Sports Medicine

## 2019-03-27 DIAGNOSIS — M5416 Radiculopathy, lumbar region: Secondary | ICD-10-CM

## 2019-03-27 DIAGNOSIS — M545 Low back pain: Secondary | ICD-10-CM | POA: Diagnosis not present

## 2019-03-27 MED ORDER — GABAPENTIN 300 MG PO CAPS: ORAL_CAPSULE | ORAL | 3 refills | Status: DC

## 2019-03-27 MED ORDER — PREDNISONE 50 MG PO TABS: ORAL_TABLET | ORAL | 0 refills | Status: DC

## 2019-03-27 NOTE — Assessment & Plan Note (Signed)
Left S1 distribution radiculitis, progressive weakness in the left leg, for this reason we are going to proceed with prednisone, x-rays, MRI, gabapentin at night, formal PT, and an MRI. Return to see me in 6 weeks.

## 2019-03-27 NOTE — Patient Instructions (Signed)
Lumbosacral Radiculopathy Lumbosacral radiculopathy is a condition that involves the spinal nerves and nerve roots in the low back and bottom of the spine. The condition develops when these nerves and nerve roots move out of place or become inflamed and cause symptoms. What are the causes? This condition may be caused by:  Pressure from a disk that bulges out of place (herniated disk). A disk is a plate of soft cartilage that separates bones in the spine.  Disk changes that occur with age (disk degeneration).  A narrowing of the bones of the lower back (spinal stenosis).  A tumor.  An infection.  An injury that places sudden pressure on the disks that cushion the bones of your lower spine. What increases the risk? You are more likely to develop this condition if:  You are a male who is 30-50 years old.  You are a male who is 50-60 years old.  You use improper technique when lifting things.  You are overweight or live a sedentary lifestyle.  Your work requires frequent lifting.  You smoke.  You do repetitive activities that strain the spine. What are the signs or symptoms? Symptoms of this condition include:  Pain that goes down from your back into your legs (sciatica), usually on one side of the body. This is the most common symptom. The pain may be worse with sitting, coughing, or sneezing.  Pain and numbness in your legs.  Muscle weakness.  Tingling.  Loss of bladder control or bowel control. How is this diagnosed? This condition may be diagnosed based on:  Your symptoms and medical history.  A physical exam. If the pain is lasting, you may have tests, such as:  MRI scan.  X-ray.  CT scan.  A type of X-ray used to examine the spinal canal after injecting a dye into your spine (myelogram).  A test to measure how electrical impulses move through a nerve (nerve conduction study). How is this treated? Treatment may depend on the cause of the condition and  may include:  Working with a physical therapist.  Taking pain medicine.  Applying heat and ice to affected areas.  Doing stretches to improve flexibility.  Doing exercises to strengthen back muscles.  Having chiropractic spinal manipulation.  Using transcutaneous electrical nerve stimulation (TENS) therapy.  Getting a steroid injection in the spine. In some cases, no treatment is needed. If the condition is long-lasting (chronic), or if symptoms are severe, treatment may involve surgery or lifestyle changes, such as following a weight-loss plan. Follow these instructions at home: Activity  Avoid bending and other activities that make the problem worse.  Maintain a proper position when standing or sitting: ? When standing, keep your upper back and neck straight, with your shoulders pulled back. Avoid slouching. ? When sitting, keep your back straight and relax your shoulders. Do not round your shoulders or pull them backward.  Do not sit or stand in one place for long periods of time.  Take brief periods of rest throughout the day. This will reduce your pain. It is usually better to rest by lying down or standing, not sitting.  When you are resting for longer periods, mix in some mild activity or stretching between periods of rest. This will help to prevent stiffness and pain.  Get regular exercise. Ask your health care provider what activities are safe for you. If you were shown how to do any exercises or stretches, do them as directed by your health care provider.  Do   not lift anything that is heavier than 10 lb (4.5 kg) or the limit that you are told by your health care provider. Always use proper lifting technique, which includes: ? Bending your knees. ? Keeping the load close to your body. ? Avoiding twisting. Managing pain  If directed, put ice on the affected area: ? Put ice in a plastic bag. ? Place a towel between your skin and the bag. ? Leave the ice on for 20  minutes, 2-3 times a day.  If directed, apply heat to the affected area as often as told by your health care provider. Use the heat source that your health care provider recommends, such as a moist heat pack or a heating pad. ? Place a towel between your skin and the heat source. ? Leave the heat on for 20-30 minutes. ? Remove the heat if your skin turns bright red. This is especially important if you are unable to feel pain, heat, or cold. You may have a greater risk of getting burned.  Take over-the-counter and prescription medicines only as told by your health care provider. General instructions  Sleep on a firm mattress in a comfortable position. Try lying on your side with your knees slightly bent. If you lie on your back, put a pillow under your knees.  Do not drive or use heavy machinery while taking prescription pain medicine.  If your health care provider prescribed a diet or exercise program, follow it as directed.  Keep all follow-up visits as told by your health care provider. This is important. Contact a health care provider if:  Your pain does not improve over time, even when taking pain medicines. Get help right away if:  You develop severe pain.  Your pain suddenly gets worse.  You develop increasing weakness in your legs.  You lose the ability to control your bladder or bowel.  You have difficulty walking or balancing.  You have a fever. Summary  Lumbosacral radiculopathy is a condition that occurs when the spinal nerves and nerve roots in the lower part of the spine move out of place or become inflamed and cause symptoms.  Symptoms include pain, numbness, and tingling that go down from your back into your legs (sciatica), muscle weakness, and loss of bladder control or bowel control.  If directed, apply ice or heat to the affected area as told by your health care provider.  Follow instructions about activity, rest, and proper lifting technique. This  information is not intended to replace advice given to you by your health care provider. Make sure you discuss any questions you have with your health care provider. Document Released: 03/27/2005 Document Revised: 03/15/2017 Document Reviewed: 03/15/2017 Elsevier Patient Education  2020 Elsevier Inc.  

## 2019-03-27 NOTE — Progress Notes (Signed)
Subjective:    I'm seeing this patient as a consultation for: Gena Fray, PA-C  CC: Left leg numbness and weakness  HPI: For months now this pleasant 33 year old male has had pain in his back with radiation down the left leg, numbness and tingling to the fourth and fifth toes, as well as mild to moderate weakness in the left leg.  Back pain is worse with sitting, flexion, Valsalva no bowel or bladder dysfunction, saddle numbness, or constitutional symptoms, no trauma.  I reviewed the past medical history, family history, social history, surgical history, and allergies today and no changes were needed.  Please see the problem list section below in epic for further details.  Past Medical History: Past Medical History:  Diagnosis Date  . Deviated nasal septum   . Dyslipidemia 10/21/2014  . GERD (gastroesophageal reflux disease)   . IFG (impaired fasting glucose) 10/21/2014  . Migraine with visual aura 10/21/2015  . OSA (obstructive sleep apnea)   . OSA on CPAP 07/22/2015   Overview:  Followed [facility]  . Seizure disorder [facility])    Past Surgical History: Past Surgical History:  Procedure Laterality Date  . TONSILLECTOMY     Social History: Social History   Socioeconomic History  . Marital status: Married    Spouse name: Not on file  . Number of children: Not on file  . Years of education: Not on file  . Highest education level: Not on file  Occupational History    Comment: IT  Tobacco Use  . Smoking status: Never Smoker  . Smokeless tobacco: Never Used  Substance and Sexual Activity  . Alcohol use: Yes    Alcohol/week: 4.0 standard drinks    Types: 4 Standard drinks or equivalent per week  . Drug use: Never  . Sexual activity: Yes    Birth control/protection: None  Other Topics Concern  . Not on file  Social History Narrative  . Not on file   Social Determinants of Health   Financial Resource Strain:   . Difficulty of Paying Living  Expenses: Not on file  Food Insecurity:   . Worried About Programme researcher, broadcasting/film/video in the Last Year: Not on file  . Ran Out of Food in the Last Year: Not on file  Transportation Needs:   . Lack of Transportation (Medical): Not on file  . Lack of Transportation (Non-Medical): Not on file  Physical Activity:   . Days of Exercise per Week: Not on file  . Minutes of Exercise per Session: Not on file  Stress:   . Feeling of Stress : Not on file  Social Connections:   . Frequency of Communication with Friends and Family: Not on file  . Frequency of Social Gatherings with Friends and Family: Not on file  . Attends Religious Services: Not on file  . Active Member of Clubs or Organizations: Not on file  . Attends Banker Meetings: Not on file  . Marital Status: Not on file   Family History: Family History  Problem Relation Age of Onset  . Hypothyroidism Mother   . Breast cancer Mother    Allergies: Allergies  Allergen Reactions  . Metoclopramide Palpitations        Medications: See med rec.  Review of Systems: No headache, visual changes, nausea, vomiting, diarrhea, constipation, dizziness, abdominal pain, skin rash, fevers, chills, night sweats, weight loss, swollen lymph nodes, body aches, joint swelling, muscle aches, chest pain, shortness of breath, mood changes, visual or  auditory hallucinations.   Objective:   General: Well Developed, well nourished, and in no acute distress.  Neuro:  Extra-ocular muscles intact, able to move all 4 extremities, sensation grossly intact.  Deep tendon reflexes tested were normal. Psych: Alert and oriented, mood congruent with affect. ENT:  Ears and nose appear unremarkable.  Hearing grossly normal. Neck: Unremarkable overall appearance, trachea midline.  No visible thyroid enlargement. Eyes: Conjunctivae and lids appear unremarkable.  Pupils equal and round. Skin: Warm and dry, no rashes noted.  Cardiovascular: Good palpable dorsalis  pedis and posterior tibial pulses., no extremity edema. Back Exam:  Inspection: Unremarkable  Motion: Flexion 45 deg, Extension 45 deg, Side Bending to 45 deg bilaterally,  Rotation to 45 deg bilaterally  SLR laying: Negative  XSLR laying: Negative  Palpable tenderness: None. FABER: negative. Sensory change: Gross sensation intact to all lumbar and sacral dermatomes.  Reflexes: 2+ at both patellar tendons, 2+ at the right Achilles, 0 at the left Achilles, Babinski's downgoing.  Strength at foot  Plantar-flexion: 5/5 Dorsi-flexion: 5/5 Eversion: 5/5 Inversion: 5/5  Leg strength  Quad: 5/5 Hamstring: 5/5 Hip flexor: 5/5 Hip abductors: 5/5  Gait unremarkable.  Impression and Recommendations:   This case required medical decision making of moderate complexity.  Left lumbar radiculitis Left S1 distribution radiculitis, progressive weakness in the left leg, for this reason we are going to proceed with prednisone, x-rays, MRI, gabapentin at night, formal PT, and an MRI. Return to see me in 6 weeks.   ___________________________________________ Gwen Her. Dianah Field, M.D., ABFM., CAQSM. Primary Care and Sports Medicine [facility]  Adjunct Professor of [employer] [facility] [employer]

## 2019-04-02 DIAGNOSIS — J342 Deviated nasal septum: Secondary | ICD-10-CM | POA: Diagnosis not present

## 2019-04-02 DIAGNOSIS — J329 Chronic sinusitis, unspecified: Secondary | ICD-10-CM | POA: Diagnosis not present

## 2019-04-02 DIAGNOSIS — H6980 Other specified disorders of Eustachian tube, unspecified ear: Secondary | ICD-10-CM | POA: Diagnosis not present

## 2019-04-07 DIAGNOSIS — M7611 Psoas tendinitis, right hip: Secondary | ICD-10-CM | POA: Diagnosis not present

## 2019-04-07 DIAGNOSIS — M9903 Segmental and somatic dysfunction of lumbar region: Secondary | ICD-10-CM | POA: Diagnosis not present

## 2019-04-07 DIAGNOSIS — M9901 Segmental and somatic dysfunction of cervical region: Secondary | ICD-10-CM | POA: Diagnosis not present

## 2019-04-07 DIAGNOSIS — M25551 Pain in right hip: Secondary | ICD-10-CM | POA: Diagnosis not present

## 2019-04-07 DIAGNOSIS — M9904 Segmental and somatic dysfunction of sacral region: Secondary | ICD-10-CM | POA: Diagnosis not present

## 2019-04-07 DIAGNOSIS — S39012A Strain of muscle, fascia and tendon of lower back, initial encounter: Secondary | ICD-10-CM | POA: Diagnosis not present

## 2019-04-07 DIAGNOSIS — M9906 Segmental and somatic dysfunction of lower extremity: Secondary | ICD-10-CM | POA: Diagnosis not present

## 2019-04-07 DIAGNOSIS — M9905 Segmental and somatic dysfunction of pelvic region: Secondary | ICD-10-CM | POA: Diagnosis not present

## 2019-04-09 DIAGNOSIS — M9903 Segmental and somatic dysfunction of lumbar region: Secondary | ICD-10-CM | POA: Diagnosis not present

## 2019-04-09 DIAGNOSIS — M9905 Segmental and somatic dysfunction of pelvic region: Secondary | ICD-10-CM | POA: Diagnosis not present

## 2019-04-09 DIAGNOSIS — M25551 Pain in right hip: Secondary | ICD-10-CM | POA: Diagnosis not present

## 2019-04-09 DIAGNOSIS — S39012A Strain of muscle, fascia and tendon of lower back, initial encounter: Secondary | ICD-10-CM | POA: Diagnosis not present

## 2019-04-14 DIAGNOSIS — M9905 Segmental and somatic dysfunction of pelvic region: Secondary | ICD-10-CM | POA: Diagnosis not present

## 2019-04-14 DIAGNOSIS — M25551 Pain in right hip: Secondary | ICD-10-CM | POA: Diagnosis not present

## 2019-04-14 DIAGNOSIS — M9903 Segmental and somatic dysfunction of lumbar region: Secondary | ICD-10-CM | POA: Diagnosis not present

## 2019-04-14 DIAGNOSIS — S39012A Strain of muscle, fascia and tendon of lower back, initial encounter: Secondary | ICD-10-CM | POA: Diagnosis not present

## 2019-04-18 DIAGNOSIS — J3489 Other specified disorders of nose and nasal sinuses: Secondary | ICD-10-CM | POA: Diagnosis not present

## 2019-04-18 DIAGNOSIS — J329 Chronic sinusitis, unspecified: Secondary | ICD-10-CM | POA: Diagnosis not present

## 2019-04-18 DIAGNOSIS — J343 Hypertrophy of nasal turbinates: Secondary | ICD-10-CM | POA: Diagnosis not present

## 2019-04-18 DIAGNOSIS — J342 Deviated nasal septum: Secondary | ICD-10-CM | POA: Diagnosis not present

## 2019-05-08 ENCOUNTER — Ambulatory Visit: Payer: BC Managed Care – PPO | Admitting: Sports Medicine

## 2019-05-11 ENCOUNTER — Ambulatory Visit (INDEPENDENT_AMBULATORY_CARE_PROVIDER_SITE_OTHER): Payer: BC Managed Care – PPO

## 2019-05-11 ENCOUNTER — Other Ambulatory Visit: Payer: Self-pay

## 2019-05-11 DIAGNOSIS — M5416 Radiculopathy, lumbar region: Secondary | ICD-10-CM | POA: Diagnosis not present

## 2019-05-11 DIAGNOSIS — M545 Low back pain: Secondary | ICD-10-CM | POA: Diagnosis not present

## 2019-05-12 DIAGNOSIS — M9903 Segmental and somatic dysfunction of lumbar region: Secondary | ICD-10-CM | POA: Diagnosis not present

## 2019-05-12 DIAGNOSIS — M9905 Segmental and somatic dysfunction of pelvic region: Secondary | ICD-10-CM | POA: Diagnosis not present

## 2019-05-12 DIAGNOSIS — S39012A Strain of muscle, fascia and tendon of lower back, initial encounter: Secondary | ICD-10-CM | POA: Diagnosis not present

## 2019-05-12 DIAGNOSIS — M25551 Pain in right hip: Secondary | ICD-10-CM | POA: Diagnosis not present

## 2019-05-29 ENCOUNTER — Ambulatory Visit: Payer: BC Managed Care – PPO | Admitting: Sports Medicine

## 2019-07-02 DIAGNOSIS — G40909 Epilepsy, unspecified, not intractable, without status epilepticus: Secondary | ICD-10-CM | POA: Diagnosis not present

## 2019-07-02 DIAGNOSIS — M542 Cervicalgia: Secondary | ICD-10-CM | POA: Diagnosis not present

## 2019-07-02 DIAGNOSIS — G43109 Migraine with aura, not intractable, without status migrainosus: Secondary | ICD-10-CM | POA: Diagnosis not present

## 2019-07-02 DIAGNOSIS — Z5181 Encounter for therapeutic drug level monitoring: Secondary | ICD-10-CM | POA: Diagnosis not present

## 2019-07-02 DIAGNOSIS — Z79899 Other long term (current) drug therapy: Secondary | ICD-10-CM | POA: Diagnosis not present

## 2019-07-04 DIAGNOSIS — M25551 Pain in right hip: Secondary | ICD-10-CM | POA: Diagnosis not present

## 2019-07-04 DIAGNOSIS — S39012A Strain of muscle, fascia and tendon of lower back, initial encounter: Secondary | ICD-10-CM | POA: Diagnosis not present

## 2019-07-04 DIAGNOSIS — M9903 Segmental and somatic dysfunction of lumbar region: Secondary | ICD-10-CM | POA: Diagnosis not present

## 2019-07-04 DIAGNOSIS — M9905 Segmental and somatic dysfunction of pelvic region: Secondary | ICD-10-CM | POA: Diagnosis not present

## 2019-08-18 DIAGNOSIS — M9903 Segmental and somatic dysfunction of lumbar region: Secondary | ICD-10-CM | POA: Diagnosis not present

## 2019-08-18 DIAGNOSIS — M25551 Pain in right hip: Secondary | ICD-10-CM | POA: Diagnosis not present

## 2019-08-18 DIAGNOSIS — M9905 Segmental and somatic dysfunction of pelvic region: Secondary | ICD-10-CM | POA: Diagnosis not present

## 2019-08-18 DIAGNOSIS — S39012A Strain of muscle, fascia and tendon of lower back, initial encounter: Secondary | ICD-10-CM | POA: Diagnosis not present

## 2020-10-04 IMAGING — MR MR LUMBAR SPINE W/O CM
4 of 5 series · 25 of 48 positions shown · non-contrast
Comparison: Lumbar radiographs 03/27/2019.

CLINICAL DATA: 33-year-old male with low back pain and progressive
left S1 radiculopathy and deficit. Posterior leg pain radiating from
the buttock to the foot with numbness in the toes for 6 months.

EXAM:
MRI LUMBAR SPINE WITHOUT CONTRAST
TECHNIQUE: Multiplanar, multisequence MR imaging of the lumbar spine was
performed. No intravenous contrast was administered.

[Series 4: T2 · sagittal · 4.0mm · 0.81mm/px · 6 of 17 slices shown (1 of 2)]
[im 1/17]
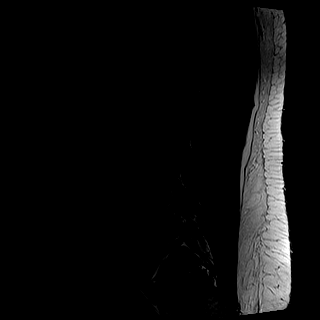
[im 4/17]
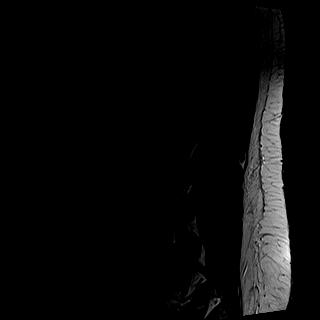
[im 7/17]
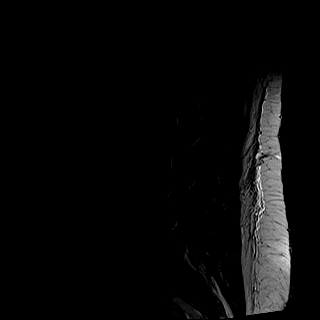
[im 10/17]
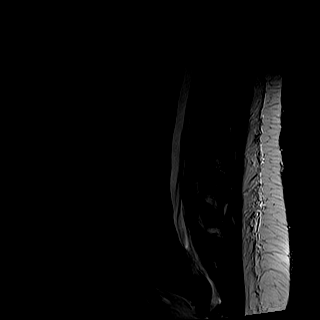
[im 13/17]
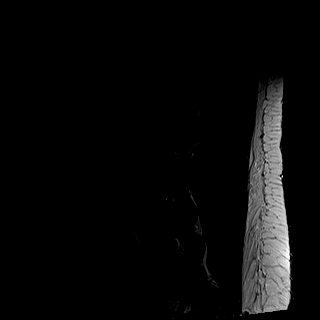
[im 17/17]
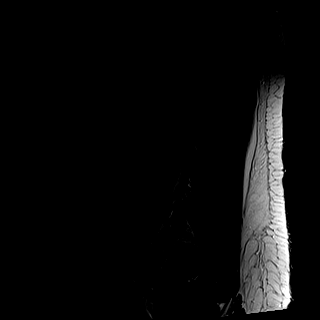

[Series 5: T1 · sagittal · 4.0mm · 0.41mm/px · 6 of 17 slices shown (1 of 2)]
[im 1/17]
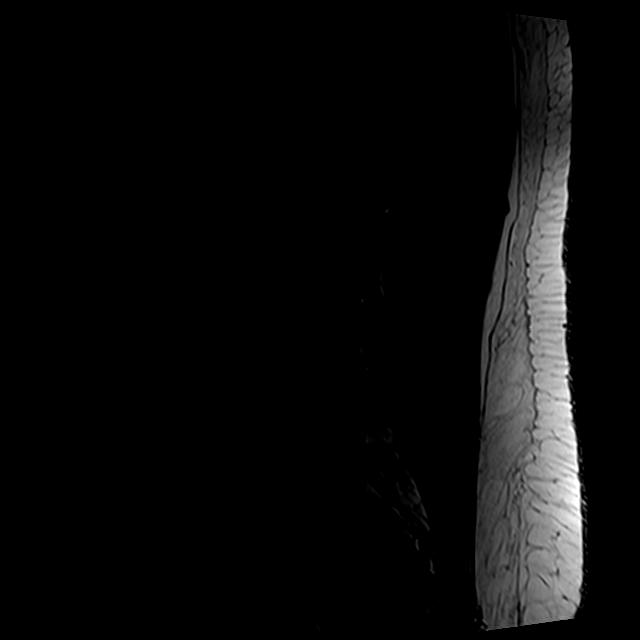
[im 4/17]
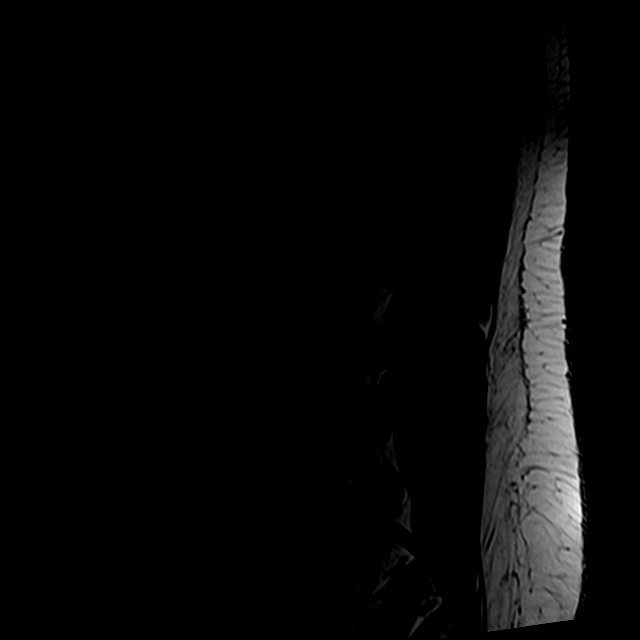
[im 7/17]
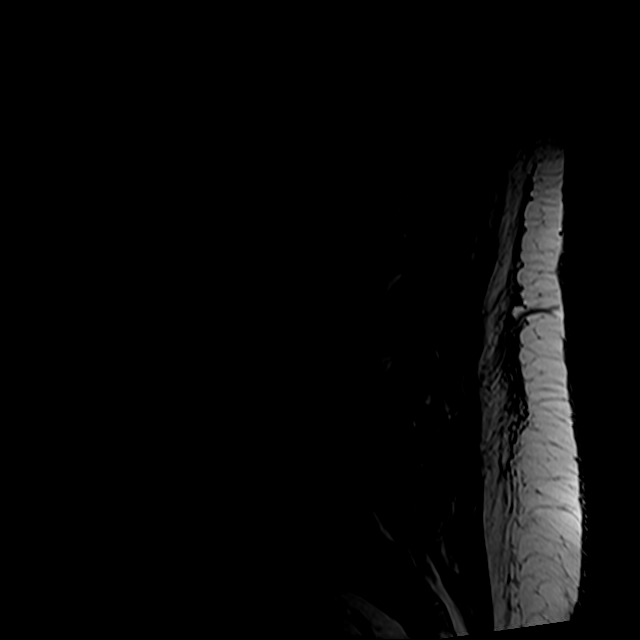
[im 10/17]
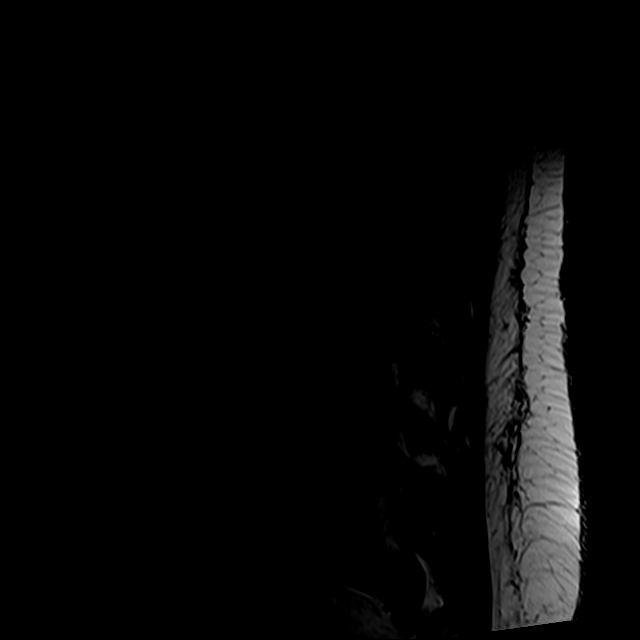
[im 13/17]
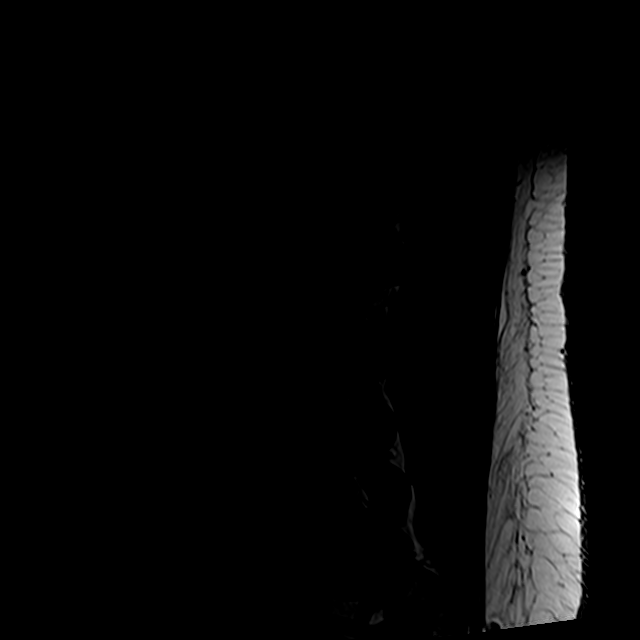
[im 17/17]
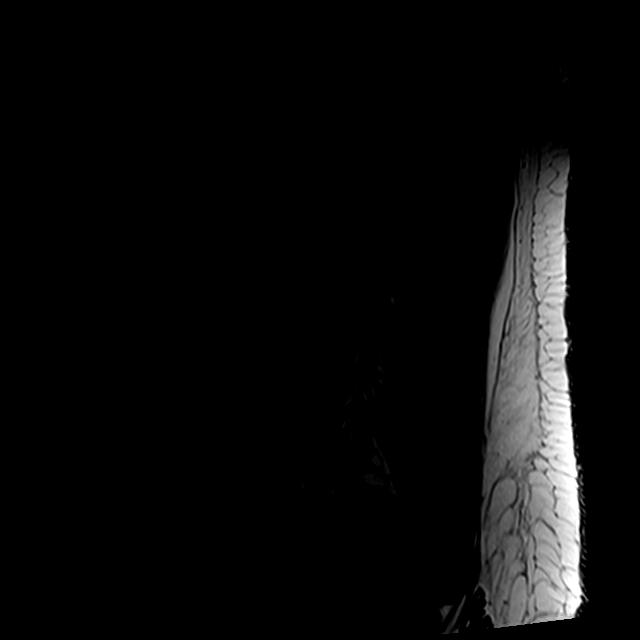

[Series 7: T2 · axial · 4.0mm · 0.78mm/px · z∈[-168,+67]mm · 9 of 42 slices shown (2 of 2)]
[im 1/42]
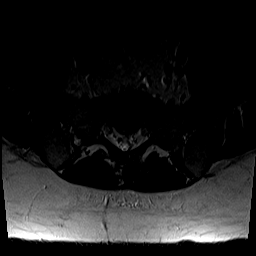
[im 6/42]
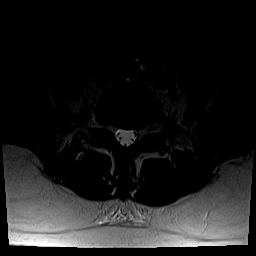
[im 12/42]
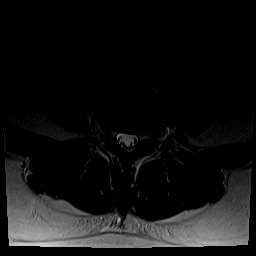
[im 18/42]
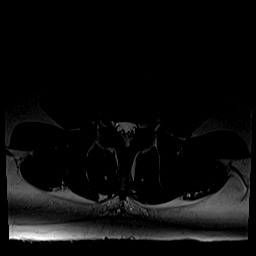
[im 21/42]
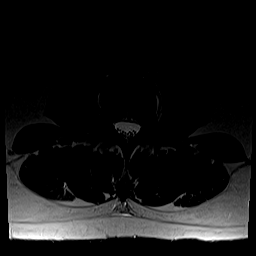
[im 24/42]
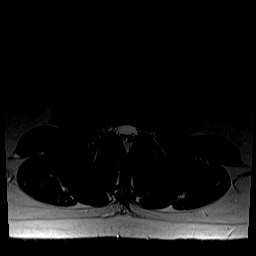
[im 30/42]
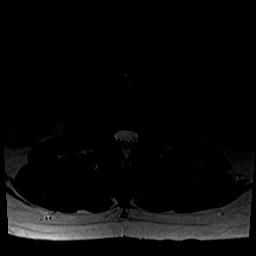
[im 36/42]
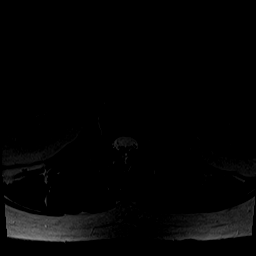
[im 42/42]
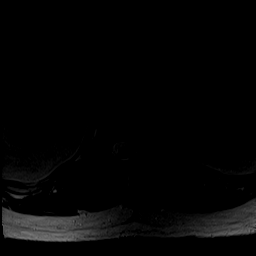

[Series 8: T1 · axial · 4.0mm · 0.39mm/px · z∈[-168,+37]mm · 4 of 42 slices shown (2 of 2)]
[im 1/42]
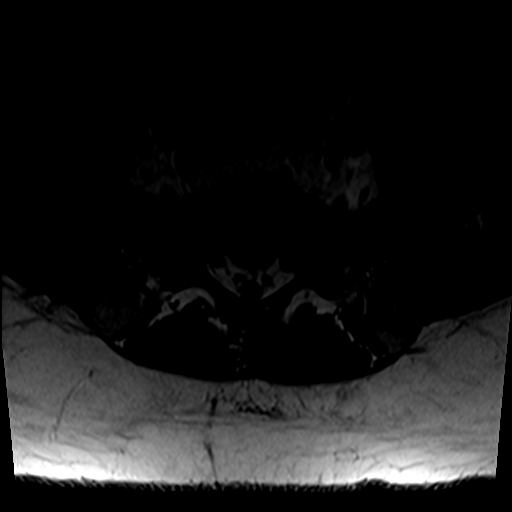
[im 6/42]
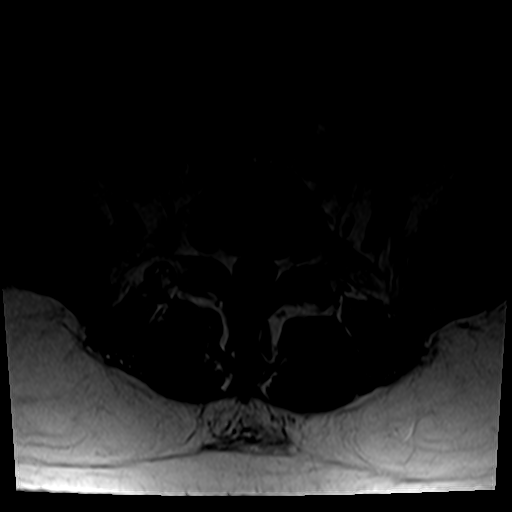
[im 21/42]
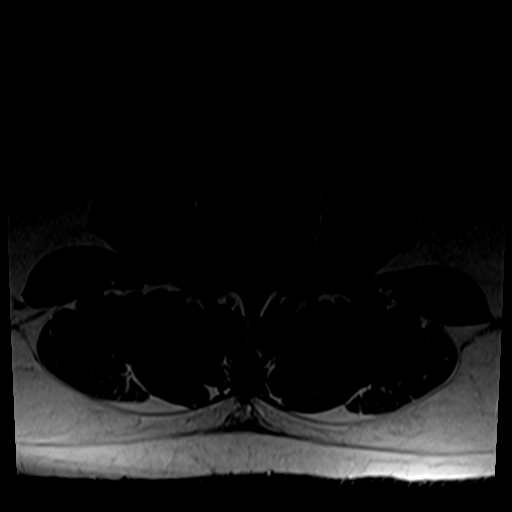
[im 36/42]
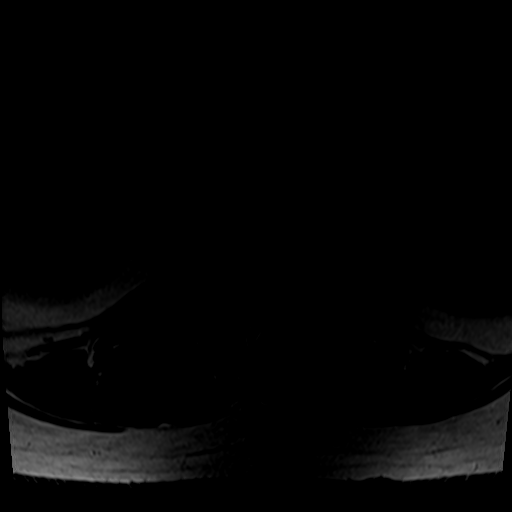

[25 of 48 positions shown; findings below may reference images not displayed]

FINDINGS: Segmentation:  Normal on the comparison.

Alignment: Stable mild straightening of lumbar lordosis. No
spondylolisthesis.

Vertebrae: No marrow edema or evidence of acute osseous abnormality.
Visualized bone marrow signal is within normal limits. Intact
visible sacrum and SI joints.

Conus medullaris and cauda equina: Conus extends to the T12-L1
level. No lower spinal cord or conus signal abnormality. Incidental
small fatty filum terminalis (normal variant). Fairly capacious
spinal canal.

Paraspinal and other soft tissues: Negative.

Disc levels:

T12-L1:  Negative.

L1-L2:  Negative.

L2-L3:  Negative disc.  Mild facet hypertrophy.  No stenosis.

L3-L4:  Negative disc.  Mild facet hypertrophy.  No stenosis.

L4-L5: Disc desiccation. Minimal circumferential disc bulge. Mild
facet hypertrophy. Mild left L4 foraminal stenosis.

L5-S1: Disc desiccation. There is a broad-based right foraminal disc
protrusion identified with associated endplate spurring (series 4,
image 5 and series 7, image 38, with mild to moderate right L5
foraminal stenosis. However, the contralateral left L5 foramen
appears normal. Also, there is no spinal or lateral recess stenosis.
The exiting left L5 and S1 nerves appear normal.
IMPRESSION: 1. L4-L5 and L5-S1 disc degeneration with mild underlying lumbar
facet hypertrophy.
2. However, the only left side neural impingement identified is at
L4-L5 where mild left foraminal stenosis is related to combined disc
bulging and facet hypertrophy. Query Left L4 radiculitis.
3. There is right L5 neural foraminal impingement at L5-S1, but
normal appearance of the course of the left L5 and S1 nerves.

## 2020-11-12 ENCOUNTER — Ambulatory Visit (INDEPENDENT_AMBULATORY_CARE_PROVIDER_SITE_OTHER): Payer: Self-pay | Admitting: Family Medicine

## 2020-11-12 ENCOUNTER — Encounter: Payer: Self-pay | Admitting: Family Medicine

## 2020-11-12 VITALS — BP 112/77 | HR 55 | Temp 98.1°F | Ht 73.0 in | Wt 273.0 lb

## 2020-11-12 DIAGNOSIS — Z0000 Encounter for general adult medical examination without abnormal findings: Secondary | ICD-10-CM

## 2020-11-12 DIAGNOSIS — R7989 Other specified abnormal findings of blood chemistry: Secondary | ICD-10-CM

## 2020-11-12 DIAGNOSIS — E785 Hyperlipidemia, unspecified: Secondary | ICD-10-CM

## 2020-11-12 NOTE — Patient Instructions (Signed)
Preventive Care 21-36 Years Old, Male Preventive care refers to lifestyle choices and visits with your health care provider that can promote health and wellness. This includes: A yearly physical exam. This is also called an annual wellness visit. Regular dental and eye exams. Immunizations. Screening for certain conditions. Healthy lifestyle choices, such as: Eating a healthy diet. Getting regular exercise. Not using drugs or products that contain nicotine and tobacco. Limiting alcohol use. What can I expect for my preventive care visit? Physical exam Your health care provider may check your: Height and weight. These may be used to calculate your BMI (body mass index). BMI is a measurement that tells if you are at a healthy weight. Heart rate and blood pressure. Body temperature. Skin for abnormal spots. Counseling Your health care provider may ask you questions about your: Past medical problems. Family's medical history. Alcohol, tobacco, and drug use. Emotional well-being. Home life and relationship well-being. Sexual activity. Diet, exercise, and sleep habits. Work and work environment. Access to firearms. What immunizations do I need?  Vaccines are usually given at various ages, according to a schedule. Your health care provider will recommend vaccines for you based on your age, medicalhistory, and lifestyle or other factors, such as travel or where you work. What tests do I need? Blood tests Lipid and cholesterol levels. These may be checked every 5 years starting at age 20. Hepatitis C test. Hepatitis B test. Screening  Diabetes screening. This is done by checking your blood sugar (glucose) after you have not eaten for a while (fasting). Genital exam to check for testicular cancer or hernias. STD (sexually transmitted disease) testing, if you are at risk. Talk with your health care provider about your test results, treatment options,and if necessary, the need for more  tests. Follow these instructions at home: Eating and drinking  Eat a healthy diet that includes fresh fruits and vegetables, whole grains, lean protein, and low-fat dairy products. Drink enough fluid to keep your urine pale yellow. Take vitamin and mineral supplements as recommended by your health care provider. Do not drink alcohol if your health care provider tells you not to drink. If you drink alcohol: Limit how much you have to 0-2 drinks a day. Be aware of how much alcohol is in your drink. In the [place] one drink equals one 12 oz bottle of beer (355 mL), one 5 oz glass of wine (148 mL), or one 1 oz glass of hard liquor (44 mL).  Lifestyle Take daily care of your teeth and gums. Brush your teeth every morning and night with fluoride toothpaste. Floss one time each day. Stay active. Exercise for at least 30 minutes 5 or more days each week. Do not use any products that contain nicotine or tobacco, such as cigarettes, e-cigarettes, and chewing tobacco. If you need help quitting, ask your health care provider. Do not use drugs. If you are sexually active, practice safe sex. Use a condom or other form of protection to prevent STIs (sexually transmitted infections). Find healthy ways to cope with stress, such as: Meditation, yoga, or listening to music. Journaling. Talking to a trusted person. Spending time with friends and family. Safety Always wear your seat belt while driving or riding in a vehicle. Do not drive: If you have been drinking alcohol. Do not ride with someone who has been drinking. When you are tired or distracted. While texting. Wear a helmet and other protective equipment during sports activities. If you have firearms in your house, make sure   you follow all gun safety procedures. Seek help if you have been physically or sexually abused. What's next? Go to your health care provider once a year for an annual wellness visit. Ask your health care provider how often  you should have your eyes and teeth checked. Stay up to date on all vaccines. This information is not intended to replace advice given to you by your health care provider. Make sure you discuss any questions you have with your healthcare provider. Document Revised: 12/11/2018 Document Reviewed: 03/21/2018 Elsevier Patient Education  2022 Elsevier Inc.  

## 2020-11-13 LAB — COMPLETE METABOLIC PANEL WITH GFR
AG Ratio: 2 (calc) (ref 1.0–2.5)
ALT: 25 U/L (ref 9–46)
AST: 15 U/L (ref 10–40)
Albumin: 4.7 g/dL (ref 3.6–5.1)
Alkaline phosphatase (APISO): 37 U/L (ref 36–130)
BUN: 20 mg/dL (ref 7–25)
CO2: 22 mmol/L (ref 20–32)
Calcium: 9.1 mg/dL (ref 8.6–10.3)
Chloride: 110 mmol/L (ref 98–110)
Creat: 1.19 mg/dL (ref 0.60–1.26)
Globulin: 2.3 g/dL (calc) (ref 1.9–3.7)
Glucose, Bld: 102 mg/dL — ABNORMAL HIGH (ref 65–99)
Potassium: 4.2 mmol/L (ref 3.5–5.3)
Sodium: 142 mmol/L (ref 135–146)
Total Bilirubin: 0.8 mg/dL (ref 0.2–1.2)
Total Protein: 7 g/dL (ref 6.1–8.1)
eGFR: 82 mL/min/{1.73_m2} (ref >=60)

## 2020-11-13 LAB — LIPID PANEL W/REFLEX DIRECT LDL
Cholesterol: 232 mg/dL — ABNORMAL HIGH (ref <200)
HDL: 44 mg/dL (ref >=40)
LDL Cholesterol (Calc): 160 mg/dL (calc) — ABNORMAL HIGH
Non-HDL Cholesterol (Calc): 188 mg/dL (calc) — ABNORMAL HIGH (ref <130)
Total CHOL/HDL Ratio: 5.3 (calc) — ABNORMAL HIGH (ref <5.0)
Triglycerides: 150 mg/dL — ABNORMAL HIGH (ref <150)

## 2020-11-14 DIAGNOSIS — Z0000 Encounter for general adult medical examination without abnormal findings: Secondary | ICD-10-CM | POA: Insufficient documentation

## 2020-11-14 NOTE — Progress Notes (Signed)
Benjamin Mendoza - 35 y.o. male MRN [id number]  Date of birth: [birth year]  Subjective Chief Complaint  Patient presents with   Annual Exam    HPI Benjamin Mendoza is a 35 year old male here today for annual exam.  He has history of seizure and migraine disorder he does see neurology for this.  Stable symptoms at this time.  No seizure-like activity in several years.  He is a non-smoker.  He does consume about 2-3 alcoholic beverages per week.  He does exercise fairly regularly.  He feels like his diet is pretty good.  He does need updated labs.  Review of Systems  Constitutional:  Negative for chills, fever, malaise/fatigue and weight loss.  HENT:  Negative for congestion, ear pain and sore throat.   Eyes:  Negative for blurred vision, double vision and pain.  Respiratory:  Negative for cough and shortness of breath.   Cardiovascular:  Negative for chest pain and palpitations.  Gastrointestinal:  Negative for abdominal pain, blood in stool, constipation, heartburn and nausea.  Genitourinary:  Negative for dysuria and urgency.  Musculoskeletal:  Negative for joint pain and myalgias.  Neurological:  Negative for dizziness and headaches.  Endo/Heme/Allergies:  Does not bruise/bleed easily.  Psychiatric/Behavioral:  Negative for depression. The patient is not nervous/anxious and does not have insomnia.     Allergies  Allergen Reactions   Metoclopramide Palpitations        Dexamethasone Other (See Comments)    Unsure    Past Medical History:  Diagnosis Date   Deviated nasal septum    Dyslipidemia 10/21/2014   GERD (gastroesophageal reflux disease)    IFG (impaired fasting glucose) 10/21/2014   Migraine with visual aura 10/21/2015   OSA (obstructive sleep apnea)    OSA on CPAP 07/22/2015   Overview:  Followed [facility]   Seizure disorder [facility])     Past Surgical History:  Procedure Laterality Date   TONSILLECTOMY      Social History   Socioeconomic History    Marital status: Married    Spouse name: Not on file   Number of children: Not on file   Years of education: Not on file   Highest education level: Not on file  Occupational History    Comment: IT  Tobacco Use   Smoking status: Never   Smokeless tobacco: Never  Vaping Use   Vaping Use: Never used  Substance and Sexual Activity   Alcohol use: Yes    Alcohol/week: 4.0 standard drinks    Types: 4 Standard drinks or equivalent per week   Drug use: Never   Sexual activity: Yes    Birth control/protection: None  Other Topics Concern   Not on file  Social History Narrative   Not on file   Social Determinants of Health   Financial Resource Strain: Not on file  Food Insecurity: Not on file  Transportation Needs: Not on file  Physical Activity: Not on file  Stress: Not on file  Social Connections: Not on file    Family History  Problem Relation Age of Onset   Hypothyroidism Mother    Breast cancer Mother     Health Maintenance  Topic Date Due   COVID-19 Vaccine (1) Never done   Pneumococcal Vaccine 42-35 Years old (1 - PCV) Never done   HIV Screening  Never done   Hepatitis C Screening  Never done   INFLUENZA VACCINE  11/08/2020   TETANUS/TDAP  04/10/2021   HPV VACCINES  Aged Out     -----------------------------------------------------------------------------------------------------------------------------------------------------------------------------------------------------------------  Physical Exam BP 112/77   Pulse (!) 55   Temp 98.1 F (36.7 C) (Oral)   Wt 273 lb (123.8 kg)   SpO2 96% Comment: on RA  BMI 36.02 kg/m   Physical Exam Constitutional:      General: He is not in acute distress. HENT:     Head: Normocephalic and atraumatic.     Right Ear: Tympanic membrane and external ear normal.     Left Ear: Tympanic membrane and external ear normal.  Eyes:     General: No scleral icterus. Neck:     Thyroid: No thyromegaly.  Cardiovascular:     Rate  and Rhythm: Normal rate and regular rhythm.     Heart sounds: Normal heart sounds.  Pulmonary:     Effort: Pulmonary effort is normal.     Breath sounds: Normal breath sounds.  Abdominal:     General: Bowel sounds are normal. There is no distension.     Palpations: Abdomen is soft.     Tenderness: There is no abdominal tenderness. There is no guarding.  Musculoskeletal:     Cervical back: Normal range of motion.  Lymphadenopathy:     Cervical: No cervical adenopathy.  Skin:    General: Skin is warm and dry.     Findings: No rash.  Neurological:     Mental Status: He is alert and oriented to person, place, and time.     Cranial Nerves: No cranial nerve deficit.     Motor: No abnormal muscle tone.  Psychiatric:        Mood and Affect: Mood normal.        Behavior: Behavior normal.    ------------------------------------------------------------------------------------------------------------------------------------------------------------------------------------------------------------------- Assessment and Plan  Well adult exam Well adult Orders Placed This Encounter  Procedures   COMPLETE METABOLIC PANEL WITH GFR   Lipid Panel w/reflex Direct LDL  Screening: Up-to-date Immunizations: Up-to-date at this time. Anticipatory guidance/risk factor reduction: Recommendations per AVS.   No orders of the defined types were placed in this encounter.   No follow-ups on file.    This visit occurred during the SARS-CoV-2 public health emergency.  Safety protocols were in place, including screening questions prior to the visit, additional usage of staff PPE, and extensive cleaning of exam room while observing appropriate contact time as indicated for disinfecting solutions.

## 2020-11-14 NOTE — Assessment & Plan Note (Signed)
Well adult Orders Placed This Encounter  Procedures  . COMPLETE METABOLIC PANEL WITH GFR  . Lipid Panel w/reflex Direct LDL  Screening: Up-to-date Immunizations: Up-to-date at this time. Anticipatory guidance/risk factor reduction: Recommendations per AVS.

## 2020-11-24 ENCOUNTER — Other Ambulatory Visit: Payer: Self-pay

## 2020-11-24 DIAGNOSIS — E785 Hyperlipidemia, unspecified: Secondary | ICD-10-CM

## 2020-11-24 MED ORDER — ROSUVASTATIN CALCIUM 20 MG PO TABS: 20.0000 mg | ORAL_TABLET | Freq: Every day | ORAL | 3 refills | Status: DC

## 2020-11-24 NOTE — Addendum Note (Signed)
Addended by: Ardyth Man on: 11/24/2020 02:37 PM   Modules accepted: Orders

## 2021-06-03 ENCOUNTER — Other Ambulatory Visit: Payer: Self-pay

## 2021-06-03 ENCOUNTER — Ambulatory Visit (INDEPENDENT_AMBULATORY_CARE_PROVIDER_SITE_OTHER): Payer: 59 | Admitting: Sports Medicine

## 2021-06-03 VITALS — BP 128/86 | HR 79 | Wt 278.0 lb

## 2021-06-03 DIAGNOSIS — R519 Headache, unspecified: Secondary | ICD-10-CM | POA: Diagnosis not present

## 2021-06-03 DIAGNOSIS — R5383 Other fatigue: Secondary | ICD-10-CM | POA: Diagnosis not present

## 2021-06-03 MED ORDER — AZITHROMYCIN 250 MG PO TABS: ORAL_TABLET | ORAL | 0 refills | Status: DC

## 2021-06-03 MED ORDER — PREDNISONE 50 MG PO TABS: 50.0000 mg | ORAL_TABLET | Freq: Every day | ORAL | 0 refills | Status: DC

## 2021-06-03 NOTE — Progress Notes (Addendum)
    Procedures performed today:    None.  Independent interpretation of notes and tests performed by another provider:   None.  Brief History, Exam, Impression, and Recommendations:    Facial pain This is a pleasant 36 year old male, for a month now he has had pain left side of his face, mastoid process, left maxillary sinus, and the left side of his mandible. A year ago he had a bone graft for consideration of tooth placement, never got a tooth but still has the bone graft. He then went to [place]  about a month ago and started to have viral symptoms as well as symptoms of maxillary sinusitis. He was seen by an outside provider and got 20 days of Augmentin , he tells me he took 1 pill/day, I wonder if it was prescribed as twice daily for 10 days instead. Unfortunately continues to have pain that localizes left mastoid process, exam is for the most part unrevealing with the exception of severe pain left mandible with palpation from the inside. Differential still includes acute maxillary sinusitis, dental abscess, jaw osteonecrosis. He also has severe fatigue. We did do an extensive work-up including a CT maxillofacial, prednisone , azithromycin , as well as some labs including Monospot and looking for atypical lymphocytes. The CT is ultimately going to be used to potentially plan a procedure.    ___________________________________________ Debby PARAS. Curtis, M.D., ABFM., CAQSM. Primary Care and Sports Medicine [facility]  Adjunct Instructor of Family Medicine  [employer]

## 2021-06-03 NOTE — Assessment & Plan Note (Addendum)
This is a pleasant 36 year old male, for a month now he has had pain left side of his face, mastoid process, left maxillary sinus, and the left side of his mandible. A year ago he had a bone graft for consideration of tooth placement, never got a tooth but still has the bone graft. He then went to [place] about a month ago and started to have viral symptoms as well as symptoms of maxillary sinusitis. He was seen by an outside provider and got 20 days of Augmentin, he tells me he took 1 pill/day, I wonder if it was prescribed as twice daily for 10 days instead. Unfortunately continues to have pain that localizes left mastoid process, exam is for the most part unrevealing with the exception of severe pain left mandible with palpation from the inside. Differential still includes acute maxillary sinusitis, dental abscess, jaw osteonecrosis. He also has severe fatigue. We did do an extensive work-up including a CT maxillofacial, prednisone, azithromycin, as well as some labs including Monospot and looking for atypical lymphocytes. The CT is ultimately going to be used to potentially plan a procedure.

## 2021-06-07 LAB — CBC WITH DIFFERENTIAL/PLATELET
Absolute Monocytes: 631 cells/uL (ref 200–950)
Basophils Absolute: 16 cells/uL (ref 0–200)
Basophils Relative: 0.2 %
Eosinophils Absolute: 139 cells/uL (ref 15–500)
Eosinophils Relative: 1.7 %
HCT: 48.4 % (ref 38.5–50.0)
Hemoglobin: 16.3 g/dL (ref 13.2–17.1)
Lymphs Abs: 3329 cells/uL (ref 850–3900)
MCH: 31.7 pg (ref 27.0–33.0)
MCHC: 33.7 g/dL (ref 32.0–36.0)
MCV: 94.2 fL (ref 80.0–100.0)
MPV: 10.7 fL (ref 7.5–12.5)
Monocytes Relative: 7.7 %
Neutro Abs: 4084 cells/uL (ref 1500–7800)
Neutrophils Relative %: 49.8 %
Platelets: 246 10*3/uL (ref 140–400)
RBC: 5.14 10*6/uL (ref 4.20–5.80)
RDW: 12.6 % (ref 11.0–15.0)
Total Lymphocyte: 40.6 %
WBC: 8.2 10*3/uL (ref 3.8–10.8)

## 2021-06-07 LAB — COMPREHENSIVE METABOLIC PANEL
AG Ratio: 2.3 (calc) (ref 1.0–2.5)
ALT: 25 U/L (ref 9–46)
AST: 16 U/L (ref 10–40)
Albumin: 4.9 g/dL (ref 3.6–5.1)
Alkaline phosphatase (APISO): 40 U/L (ref 36–130)
BUN/Creatinine Ratio: 12 (calc) (ref 6–22)
BUN: 17 mg/dL (ref 7–25)
CO2: 25 mmol/L (ref 20–32)
Calcium: 9.7 mg/dL (ref 8.6–10.3)
Chloride: 110 mmol/L (ref 98–110)
Creat: 1.41 mg/dL — ABNORMAL HIGH (ref 0.60–1.26)
Globulin: 2.1 g/dL (calc) (ref 1.9–3.7)
Glucose, Bld: 75 mg/dL (ref 65–139)
Potassium: 4.2 mmol/L (ref 3.5–5.3)
Sodium: 144 mmol/L (ref 135–146)
Total Bilirubin: 0.7 mg/dL (ref 0.2–1.2)
Total Protein: 7 g/dL (ref 6.1–8.1)

## 2021-06-07 LAB — TESTOSTERONE, FREE & TOTAL
Free Testosterone: 31.4 pg/mL — ABNORMAL LOW (ref 35.0–155.0)
Testosterone, Total, LC-MS-MS: 222 ng/dL — ABNORMAL LOW (ref 250–1100)

## 2021-06-07 LAB — PATHOLOGIST SMEAR REVIEW

## 2021-06-07 LAB — MONONUCLEOSIS SCREEN: Heterophile, Mono Screen: NEGATIVE

## 2021-06-07 LAB — TSH: TSH: 1.86 mIU/L (ref 0.40–4.50)

## 2021-06-17 ENCOUNTER — Telehealth: Payer: Self-pay | Admitting: Sports Medicine

## 2021-06-17 NOTE — Telephone Encounter (Signed)
Good morning - the CT for the above patient was denied. Per insurance "Your healthcare provider told us that there is a concern related to your teeth, jaw or face". The request is denied because records do not show that the study is requested to help plan a surgery. As well as any clinical notes demonstrating the reason the study is needed. Provider can call [phone] to appeal the decision.  ? ?Case #[id number] ?

## 2021-06-20 NOTE — Telephone Encounter (Signed)
Called and left message for appeal, let them know the urgency of the situation and the fact that we are probably dealing with an infection that may need intervention.  Insurance company was aware of this based on my office notes so insurance company will be responsible for any complications for the patient. ?

## 2021-07-05 DIAGNOSIS — M546 Pain in thoracic spine: Secondary | ICD-10-CM | POA: Diagnosis not present

## 2021-07-05 DIAGNOSIS — M7532 Calcific tendinitis of left shoulder: Secondary | ICD-10-CM | POA: Diagnosis not present

## 2021-07-05 DIAGNOSIS — M542 Cervicalgia: Secondary | ICD-10-CM | POA: Diagnosis not present

## 2021-07-05 DIAGNOSIS — M9907 Segmental and somatic dysfunction of upper extremity: Secondary | ICD-10-CM | POA: Diagnosis not present

## 2021-07-05 DIAGNOSIS — M9901 Segmental and somatic dysfunction of cervical region: Secondary | ICD-10-CM | POA: Diagnosis not present

## 2021-07-05 DIAGNOSIS — M9902 Segmental and somatic dysfunction of thoracic region: Secondary | ICD-10-CM | POA: Diagnosis not present

## 2021-07-26 NOTE — Telephone Encounter (Signed)
Excellent, please have imaging get him scheduled, its been far too long! ?

## 2021-07-26 NOTE — Telephone Encounter (Signed)
Task completed. Imaging dept was notified earlier this morning to contact patient for scheduling.  ?

## 2021-07-26 NOTE — Telephone Encounter (Addendum)
[employer] insurance sent a letter dated 06/30/21 regarding the process for an appeal for CT jaw/face.  ? ?The denial has been reversed. Patient now meets the necessary criteria. Per the guidelines submitted by provider, the requested imaging may be used for further evaluation of patient's symptoms.  ?

## 2021-07-29 DIAGNOSIS — M9901 Segmental and somatic dysfunction of cervical region: Secondary | ICD-10-CM | POA: Diagnosis not present

## 2021-07-29 DIAGNOSIS — M9907 Segmental and somatic dysfunction of upper extremity: Secondary | ICD-10-CM | POA: Diagnosis not present

## 2021-07-29 DIAGNOSIS — M9902 Segmental and somatic dysfunction of thoracic region: Secondary | ICD-10-CM | POA: Diagnosis not present

## 2021-07-29 DIAGNOSIS — M546 Pain in thoracic spine: Secondary | ICD-10-CM | POA: Diagnosis not present

## 2021-07-29 DIAGNOSIS — M7532 Calcific tendinitis of left shoulder: Secondary | ICD-10-CM | POA: Diagnosis not present

## 2021-07-29 DIAGNOSIS — M542 Cervicalgia: Secondary | ICD-10-CM | POA: Diagnosis not present

## 2021-08-13 DIAGNOSIS — M546 Pain in thoracic spine: Secondary | ICD-10-CM | POA: Diagnosis not present

## 2021-08-13 DIAGNOSIS — M9902 Segmental and somatic dysfunction of thoracic region: Secondary | ICD-10-CM | POA: Diagnosis not present

## 2021-08-13 DIAGNOSIS — M542 Cervicalgia: Secondary | ICD-10-CM | POA: Diagnosis not present

## 2021-08-13 DIAGNOSIS — M9907 Segmental and somatic dysfunction of upper extremity: Secondary | ICD-10-CM | POA: Diagnosis not present

## 2021-08-13 DIAGNOSIS — M9901 Segmental and somatic dysfunction of cervical region: Secondary | ICD-10-CM | POA: Diagnosis not present

## 2021-08-13 DIAGNOSIS — M7532 Calcific tendinitis of left shoulder: Secondary | ICD-10-CM | POA: Diagnosis not present

## 2021-10-31 NOTE — Progress Notes (Unsigned)
   Acute Office Visit  Subjective:     Patient ID: Benjamin Mendoza, male    DOB: [birth year], 36 y.o.   MRN: [id number]  No chief complaint on file.   Pt presents with otalgia.      ROS      Objective:    There were no vitals taken for this visit. {Vitals History (Optional):23777}  Physical Exam  No results found for any visits on 11/01/21.      Assessment & Plan:   Problem List Items Addressed This Visit   None   No orders of the defined types were placed in this encounter.   No follow-ups on file.  Charlton Amor, DO

## 2021-11-01 ENCOUNTER — Ambulatory Visit (INDEPENDENT_AMBULATORY_CARE_PROVIDER_SITE_OTHER): Payer: 59 | Admitting: Family Medicine

## 2021-11-01 ENCOUNTER — Encounter: Payer: Self-pay | Admitting: Family Medicine

## 2021-11-01 VITALS — BP 119/76 | HR 75 | Ht 73.0 in | Wt 278.0 lb

## 2021-11-01 DIAGNOSIS — H65195 Other acute nonsuppurative otitis media, recurrent, left ear: Secondary | ICD-10-CM | POA: Diagnosis not present

## 2021-11-01 DIAGNOSIS — H6690 Otitis media, unspecified, unspecified ear: Secondary | ICD-10-CM | POA: Insufficient documentation

## 2021-11-01 MED ORDER — AMOXICILLIN-POT CLAVULANATE 875-125 MG PO TABS: 1.0000 | ORAL_TABLET | Freq: Two times a day (BID) | ORAL | 0 refills | Status: AC

## 2021-11-01 NOTE — Assessment & Plan Note (Signed)
-   Patient failed amoxicillin therapy.  We will go ahead and send in Augmentin for 7 days.  Gave patient return instructions if still having left ear otalgia.  Also recommended nasal saline wash for pressure as well as daily Zyrtec. - Will refer to ENT for recurrent adult ear infections to come up with a better future treatment plan for patient.  I suspect he has some eustachian tube dysfunction that is causing his multiple ear infections a year.

## 2022-01-18 ENCOUNTER — Encounter: Payer: Self-pay | Admitting: Family Medicine

## 2022-01-18 ENCOUNTER — Ambulatory Visit (INDEPENDENT_AMBULATORY_CARE_PROVIDER_SITE_OTHER): Payer: 59 | Admitting: Family Medicine

## 2022-01-18 VITALS — BP 124/86 | HR 76 | Ht 73.0 in | Wt 280.0 lb

## 2022-01-18 DIAGNOSIS — H9202 Otalgia, left ear: Secondary | ICD-10-CM | POA: Diagnosis not present

## 2022-01-18 DIAGNOSIS — E782 Mixed hyperlipidemia: Secondary | ICD-10-CM | POA: Diagnosis not present

## 2022-01-18 MED ORDER — METHYLPREDNISOLONE 4 MG PO TBPK: ORAL_TABLET | ORAL | 0 refills | Status: DC

## 2022-01-18 MED ORDER — ROSUVASTATIN CALCIUM 20 MG PO TABS: 20.0000 mg | ORAL_TABLET | Freq: Every day | ORAL | 3 refills | Status: DC

## 2022-01-18 NOTE — Progress Notes (Signed)
Acute Office Visit  Subjective:     Patient ID: Benjamin Mendoza, male    DOB: 1985-08-20, 36 y.o.   MRN: [id number]  Chief Complaint  Patient presents with   Ear Pain    HPI Patient is in today for left ear otalgia. Pt has been having on and off pain of the left ear for about two months. I did see him in July for AOM and treated him with Augmentin after amoxicillin therapy was failed. He was referred to ENT at that time. He has not yet seen ENT. He said after the augementin he did a teledoc apt online nad was given another prescription. He says this did not help him much. Over the past few months he has noted ear pain and pressure feeling in his left ear. He does wear a nightly cpap machine.   Review of Systems  Constitutional:  Negative for chills and fever.  HENT:  Positive for ear pain.   Respiratory:  Negative for cough and shortness of breath.   Cardiovascular:  Negative for chest pain.  Neurological:  Negative for headaches.        Objective:    BP 124/86   Pulse 76   Ht 6\' 1"  (1.854 m)   Wt 280 lb (127 kg)   SpO2 99%   BMI 36.94 kg/m    Physical Exam Vitals and nursing note reviewed.  Constitutional:      General: He is not in acute distress.    Appearance: Normal appearance.  HENT:     Head: Normocephalic and atraumatic.     Right Ear: Tympanic membrane, ear canal and external ear normal.     Left Ear: Tympanic membrane, ear canal and external ear normal.     Nose: Nose normal.  Eyes:     Conjunctiva/sclera: Conjunctivae normal.  Cardiovascular:     Rate and Rhythm: Normal rate.  Pulmonary:     Effort: Pulmonary effort is normal.  Neurological:     General: No focal deficit present.     Mental Status: He is alert and oriented to person, place, and time.  Psychiatric:        Mood and Affect: Mood normal.        Behavior: Behavior normal.        Thought Content: Thought content normal.        Judgment: Judgment normal.     No results found for any  visits on 01/18/22.      Assessment & Plan:   Problem List Items Addressed This Visit       Other   Left ear pain - Primary    - on physical exam no signs of infection--I don't appreciate any bulging of the TM or erythema - pt continues to note persistent ear pain. I don't believe it is necessary for him to have an antibiotic at this time. Have sent in a medrol dose pack to see if what he is feeling is inflammation to see if we can decrease this inflammation  - have advised him to get in touch with ENT. On my end, referral has been placed and is still active.  - if no better in one week after steroids can consider another round of abx such as doxycycline but I am hesitant to do so since I do not see any exam findings of AOM today.       Relevant Medications   methylPREDNISolone (MEDROL DOSEPAK) 4 MG TBPK tablet   Mixed hyperlipidemia  Relevant Medications   rosuvastatin (CRESTOR) 20 MG tablet    Meds ordered this encounter  Medications   rosuvastatin (CRESTOR) 20 MG tablet    Sig: Take 1 tablet (20 mg total) by mouth daily.    Dispense:  90 tablet    Refill:  3   methylPREDNISolone (MEDROL DOSEPAK) 4 MG TBPK tablet    Sig: Take as directed on pack    Dispense:  21 tablet    Refill:  0    Return as scheduled with PCP.  Owens Loffler, DO

## 2022-01-18 NOTE — Assessment & Plan Note (Signed)
-   on physical exam no signs of infection--I don't appreciate any bulging of the TM or erythema - pt continues to note persistent ear pain. I don't believe it is necessary for him to have an antibiotic at this time. Have sent in a medrol dose pack to see if what he is feeling is inflammation to see if we can decrease this inflammation  - have advised him to get in touch with ENT. On my end, referral has been placed and is still active.  - if no better in one week after steroids can consider another round of abx such as doxycycline but I am hesitant to do so since I do not see any exam findings of AOM today.

## 2022-02-20 DIAGNOSIS — G43109 Migraine with aura, not intractable, without status migrainosus: Secondary | ICD-10-CM | POA: Diagnosis not present

## 2022-02-20 DIAGNOSIS — G250 Essential tremor: Secondary | ICD-10-CM | POA: Diagnosis not present

## 2022-02-20 DIAGNOSIS — G40909 Epilepsy, unspecified, not intractable, without status epilepticus: Secondary | ICD-10-CM | POA: Diagnosis not present

## 2022-02-20 DIAGNOSIS — M542 Cervicalgia: Secondary | ICD-10-CM | POA: Diagnosis not present

## 2022-02-20 DIAGNOSIS — Z5181 Encounter for therapeutic drug level monitoring: Secondary | ICD-10-CM | POA: Diagnosis not present

## 2022-02-20 DIAGNOSIS — Z79899 Other long term (current) drug therapy: Secondary | ICD-10-CM | POA: Diagnosis not present

## 2022-02-20 DIAGNOSIS — E559 Vitamin D deficiency, unspecified: Secondary | ICD-10-CM | POA: Diagnosis not present

## 2022-04-07 DIAGNOSIS — M7532 Calcific tendinitis of left shoulder: Secondary | ICD-10-CM | POA: Diagnosis not present

## 2022-04-07 DIAGNOSIS — M9901 Segmental and somatic dysfunction of cervical region: Secondary | ICD-10-CM | POA: Diagnosis not present

## 2022-04-07 DIAGNOSIS — M9902 Segmental and somatic dysfunction of thoracic region: Secondary | ICD-10-CM | POA: Diagnosis not present

## 2022-04-07 DIAGNOSIS — M546 Pain in thoracic spine: Secondary | ICD-10-CM | POA: Diagnosis not present

## 2022-04-07 DIAGNOSIS — M9907 Segmental and somatic dysfunction of upper extremity: Secondary | ICD-10-CM | POA: Diagnosis not present

## 2022-04-07 DIAGNOSIS — M542 Cervicalgia: Secondary | ICD-10-CM | POA: Diagnosis not present

## 2022-05-16 DIAGNOSIS — Z6836 Body mass index (BMI) 36.0-36.9, adult: Secondary | ICD-10-CM | POA: Diagnosis not present

## 2022-05-16 DIAGNOSIS — J0140 Acute pansinusitis, unspecified: Secondary | ICD-10-CM | POA: Diagnosis not present

## 2022-09-13 DIAGNOSIS — M546 Pain in thoracic spine: Secondary | ICD-10-CM | POA: Diagnosis not present

## 2022-09-13 DIAGNOSIS — M9901 Segmental and somatic dysfunction of cervical region: Secondary | ICD-10-CM | POA: Diagnosis not present

## 2022-09-13 DIAGNOSIS — M542 Cervicalgia: Secondary | ICD-10-CM | POA: Diagnosis not present

## 2022-09-13 DIAGNOSIS — M7532 Calcific tendinitis of left shoulder: Secondary | ICD-10-CM | POA: Diagnosis not present

## 2022-09-13 DIAGNOSIS — M9902 Segmental and somatic dysfunction of thoracic region: Secondary | ICD-10-CM | POA: Diagnosis not present

## 2022-09-13 DIAGNOSIS — M5451 Vertebrogenic low back pain: Secondary | ICD-10-CM | POA: Diagnosis not present

## 2022-09-13 DIAGNOSIS — M9907 Segmental and somatic dysfunction of upper extremity: Secondary | ICD-10-CM | POA: Diagnosis not present

## 2022-09-13 DIAGNOSIS — M9903 Segmental and somatic dysfunction of lumbar region: Secondary | ICD-10-CM | POA: Diagnosis not present

## 2022-09-22 DIAGNOSIS — M546 Pain in thoracic spine: Secondary | ICD-10-CM | POA: Diagnosis not present

## 2022-09-22 DIAGNOSIS — M542 Cervicalgia: Secondary | ICD-10-CM | POA: Diagnosis not present

## 2022-09-22 DIAGNOSIS — M5451 Vertebrogenic low back pain: Secondary | ICD-10-CM | POA: Diagnosis not present

## 2022-09-22 DIAGNOSIS — M9902 Segmental and somatic dysfunction of thoracic region: Secondary | ICD-10-CM | POA: Diagnosis not present

## 2022-09-22 DIAGNOSIS — M9901 Segmental and somatic dysfunction of cervical region: Secondary | ICD-10-CM | POA: Diagnosis not present

## 2022-09-22 DIAGNOSIS — M9903 Segmental and somatic dysfunction of lumbar region: Secondary | ICD-10-CM | POA: Diagnosis not present

## 2022-09-22 DIAGNOSIS — M7532 Calcific tendinitis of left shoulder: Secondary | ICD-10-CM | POA: Diagnosis not present

## 2022-09-22 DIAGNOSIS — M9907 Segmental and somatic dysfunction of upper extremity: Secondary | ICD-10-CM | POA: Diagnosis not present

## 2022-11-17 ENCOUNTER — Ambulatory Visit: Payer: 59 | Admitting: Sports Medicine

## 2022-11-17 ENCOUNTER — Ambulatory Visit (INDEPENDENT_AMBULATORY_CARE_PROVIDER_SITE_OTHER): Payer: 59

## 2022-11-17 DIAGNOSIS — R1032 Left lower quadrant pain: Secondary | ICD-10-CM

## 2022-11-17 DIAGNOSIS — N2889 Other specified disorders of kidney and ureter: Secondary | ICD-10-CM | POA: Diagnosis not present

## 2022-11-17 DIAGNOSIS — M222X1 Patellofemoral disorders, right knee: Secondary | ICD-10-CM | POA: Diagnosis not present

## 2022-11-17 DIAGNOSIS — Z0189 Encounter for other specified special examinations: Secondary | ICD-10-CM | POA: Diagnosis not present

## 2022-11-17 DIAGNOSIS — R109 Unspecified abdominal pain: Secondary | ICD-10-CM | POA: Diagnosis not present

## 2022-11-17 DIAGNOSIS — M25561 Pain in right knee: Secondary | ICD-10-CM

## 2022-11-17 DIAGNOSIS — E6609 Other obesity due to excess calories: Secondary | ICD-10-CM | POA: Diagnosis not present

## 2022-11-17 DIAGNOSIS — M1711 Unilateral primary osteoarthritis, right knee: Secondary | ICD-10-CM | POA: Insufficient documentation

## 2022-11-17 DIAGNOSIS — M25562 Pain in left knee: Secondary | ICD-10-CM | POA: Diagnosis not present

## 2022-11-17 DIAGNOSIS — N200 Calculus of kidney: Secondary | ICD-10-CM | POA: Diagnosis not present

## 2022-11-17 MED ORDER — MELOXICAM 15 MG PO TABS: ORAL_TABLET | ORAL | 3 refills | Status: AC

## 2022-11-17 NOTE — Progress Notes (Signed)
    Procedures performed today:    None.  Independent interpretation of notes and tests performed by another provider:   None.  Brief History, Exam, Impression, and Recommendations:    Acute pain of right knee Pleasant 37 year old male, subacute right knee pain, mostly anteriorly with increasing crepitus, can has gained about 25 pounds recently. On exam he has patellofemoral crepitus, positive painful patellar compression sign. 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 I have suggested aggressive weight loss treatment with his PCP. Return to see me in 6 weeks, injection if not better.  Colicky LLQ abdominal pain Benjamin Mendoza is having increasing left lower quadrant abdominal pain, he complained of this in 2019, again more recently precipitated by spicy foods and alcohol. No melena, hematochezia. No fever or other constitutional symptoms. It has resolved on its own. He also complained of urinary frequency during this episode. Exam is completely benign today. Differential is large including IBS, diverticulitis, urinary tract infection, nephrolithiasis, we will get some labs, CBC with differential, CMP, amylase, lipase, urinalysis, PSA. I would like an abdominal x-ray. He will follow this up with his PCP.  Class 1 obesity due to excess calories in adult Worsening obesity, this is certainly a large component of his knee pain, I would like some additional labs, we will get him set up with healthy weight [facility] wellness, I would like him to touch base with his PCP for discussion of GLP-1 treatment be it branded or compounded.    ____________________________________________ Benjamin Mendoza. Benjamin Mendoza, M.D., ABFM., CAQSM., AME. Primary Care and Sports Medicine [facility]  Adjunct Professor of Family Medicine  [employer] [facility] [employer]  Restaurant manager, fast food

## 2022-11-17 NOTE — Assessment & Plan Note (Signed)
Worsening obesity, this is certainly a large component of his knee pain, I would like some additional labs, we will get him set up with healthy weight and wellness, I would like him to touch base with his PCP for discussion of GLP-1 treatment be it branded or compounded.

## 2022-11-17 NOTE — Assessment & Plan Note (Signed)
Benjamin Mendoza is having increasing left lower quadrant abdominal pain, he complained of this in 2019, again more recently precipitated by spicy foods and alcohol. No melena, hematochezia. No fever or other constitutional symptoms. It has resolved on its own. He also complained of urinary frequency during this episode. Exam is completely benign today. Differential is large including IBS, diverticulitis, urinary tract infection, nephrolithiasis, we will get some labs, CBC with differential, CMP, amylase, lipase, urinalysis, PSA. I would like an abdominal x-ray. He will follow this up with his PCP.

## 2022-11-17 NOTE — Assessment & Plan Note (Signed)
Pleasant 37 year old male, subacute right knee pain, mostly anteriorly with increasing crepitus, can has gained about 25 pounds recently. On exam he has patellofemoral crepitus, positive painful patellar compression sign. 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 I have suggested aggressive weight loss treatment with his PCP. Return to see me in 6 weeks, injection if not better.

## 2022-11-18 LAB — CBC WITH DIFFERENTIAL/PLATELET
Basophils Absolute: 0 x10E3/uL (ref 0.0–0.2)
Basos: 1 %
EOS (ABSOLUTE): 0.2 x10E3/uL (ref 0.0–0.4)
Eos: 3 %
Hematocrit: 50.7 % (ref 37.5–51.0)
Hemoglobin: 16.1 g/dL (ref 13.0–17.7)
Immature Grans (Abs): 0 x10E3/uL (ref 0.0–0.1)
Immature Granulocytes: 0 %
Lymphocytes Absolute: 2.6 x10E3/uL (ref 0.7–3.1)
Lymphs: 43 %
MCH: 29.2 pg (ref 26.6–33.0)
MCHC: 31.8 g/dL (ref 31.5–35.7)
MCV: 92 fL (ref 79–97)
Monocytes Absolute: 0.5 x10E3/uL (ref 0.1–0.9)
Monocytes: 8 %
Neutrophils Absolute: 2.8 x10E3/uL (ref 1.4–7.0)
Neutrophils: 45 %
Platelets: 263 x10E3/uL (ref 150–450)
RBC: 5.52 x10E6/uL (ref 4.14–5.80)
RDW: 12.3 % (ref 11.6–15.4)
WBC: 6.1 x10E3/uL (ref 3.4–10.8)

## 2022-11-18 LAB — LIPASE: Lipase: 31 U/L (ref 13–78)

## 2022-11-18 LAB — LIPID PANEL
Chol/HDL Ratio: 2.7 ratio (ref 0.0–5.0)
Cholesterol, Total: 134 mg/dL (ref 100–199)
HDL: 50 mg/dL
LDL Chol Calc (NIH): 63 mg/dL (ref 0–99)
Triglycerides: 117 mg/dL (ref 0–149)
VLDL Cholesterol Cal: 21 mg/dL (ref 5–40)

## 2022-11-18 LAB — COMPREHENSIVE METABOLIC PANEL WITH GFR
ALT: 29 IU/L (ref 0–44)
AST: 22 IU/L (ref 0–40)
Albumin: 4.8 g/dL (ref 4.1–5.1)
Alkaline Phosphatase: 47 IU/L (ref 44–121)
BUN/Creatinine Ratio: 11 (ref 9–20)
BUN: 13 mg/dL (ref 6–20)
Bilirubin Total: 0.6 mg/dL (ref 0.0–1.2)
CO2: 16 mmol/L — ABNORMAL LOW (ref 20–29)
Calcium: 9 mg/dL (ref 8.7–10.2)
Chloride: 107 mmol/L — ABNORMAL HIGH (ref 96–106)
Creatinine, Ser: 1.18 mg/dL (ref 0.76–1.27)
Globulin, Total: 2.1 g/dL (ref 1.5–4.5)
Glucose: 99 mg/dL (ref 70–99)
Potassium: 4 mmol/L (ref 3.5–5.2)
Sodium: 140 mmol/L (ref 134–144)
Total Protein: 6.9 g/dL (ref 6.0–8.5)
eGFR: 82 mL/min/1.73 (ref >59)

## 2022-11-18 LAB — AMYLASE: Amylase: 54 U/L (ref 31–110)

## 2022-11-18 LAB — UA/M W/RFLX CULTURE, ROUTINE
Bilirubin, UA: NEGATIVE
Glucose, UA: NEGATIVE
Ketones, UA: NEGATIVE
Leukocytes,UA: NEGATIVE
Nitrite, UA: NEGATIVE
Protein,UA: NEGATIVE
RBC, UA: NEGATIVE
Specific Gravity, UA: 1.019 (ref 1.005–1.030)
Urobilinogen, Ur: 0.2 mg/dL (ref 0.2–1.0)
pH, UA: 7 (ref 5.0–7.5)

## 2022-11-18 LAB — MICROSCOPIC EXAMINATION
Bacteria, UA: NONE SEEN
Casts: NONE SEEN /LPF
Epithelial Cells (non renal): NONE SEEN /HPF (ref 0–10)
RBC, Urine: NONE SEEN /HPF (ref 0–2)
WBC, UA: NONE SEEN /HPF (ref 0–5)

## 2022-11-18 LAB — HEMOGLOBIN A1C
Est. average glucose Bld gHb Est-mCnc: 126 mg/dL
Hgb A1c MFr Bld: 6 % — ABNORMAL HIGH (ref 4.8–5.6)

## 2022-11-18 LAB — PSA, TOTAL AND FREE
PSA, Free Pct: 35 %
PSA, Free: 0.14 ng/mL
Prostate Specific Ag, Serum: 0.4 ng/mL (ref 0.0–4.0)

## 2022-11-18 LAB — TSH: TSH: 1.49 u[IU]/mL (ref 0.450–4.500)

## 2022-12-04 DIAGNOSIS — M9902 Segmental and somatic dysfunction of thoracic region: Secondary | ICD-10-CM | POA: Diagnosis not present

## 2022-12-04 DIAGNOSIS — M546 Pain in thoracic spine: Secondary | ICD-10-CM | POA: Diagnosis not present

## 2022-12-04 DIAGNOSIS — M9907 Segmental and somatic dysfunction of upper extremity: Secondary | ICD-10-CM | POA: Diagnosis not present

## 2022-12-04 DIAGNOSIS — M7532 Calcific tendinitis of left shoulder: Secondary | ICD-10-CM | POA: Diagnosis not present

## 2022-12-04 DIAGNOSIS — M5451 Vertebrogenic low back pain: Secondary | ICD-10-CM | POA: Diagnosis not present

## 2022-12-04 DIAGNOSIS — M9901 Segmental and somatic dysfunction of cervical region: Secondary | ICD-10-CM | POA: Diagnosis not present

## 2022-12-04 DIAGNOSIS — M9903 Segmental and somatic dysfunction of lumbar region: Secondary | ICD-10-CM | POA: Diagnosis not present

## 2022-12-04 DIAGNOSIS — M9905 Segmental and somatic dysfunction of pelvic region: Secondary | ICD-10-CM | POA: Diagnosis not present

## 2022-12-04 DIAGNOSIS — M9904 Segmental and somatic dysfunction of sacral region: Secondary | ICD-10-CM | POA: Diagnosis not present

## 2022-12-04 DIAGNOSIS — M542 Cervicalgia: Secondary | ICD-10-CM | POA: Diagnosis not present

## 2022-12-29 ENCOUNTER — Ambulatory Visit: Payer: 59 | Admitting: Sports Medicine

## 2022-12-29 ENCOUNTER — Encounter: Payer: Self-pay | Admitting: Sports Medicine

## 2022-12-29 DIAGNOSIS — M1711 Unilateral primary osteoarthritis, right knee: Secondary | ICD-10-CM

## 2022-12-29 DIAGNOSIS — E6609 Other obesity due to excess calories: Secondary | ICD-10-CM

## 2022-12-29 DIAGNOSIS — R1032 Left lower quadrant pain: Secondary | ICD-10-CM | POA: Diagnosis not present

## 2022-12-29 DIAGNOSIS — Z6831 Body mass index (BMI) 31.0-31.9, adult: Secondary | ICD-10-CM | POA: Diagnosis not present

## 2022-12-29 NOTE — Assessment & Plan Note (Signed)
Never heard back from [facility], I would like him to touch base with his PCP for discussion of GLP-1 treatment as well as dieting and exercising as an adjunct.

## 2022-12-29 NOTE — Assessment & Plan Note (Signed)
This pleasant 37 year old male returns, he started with patellofemoral type pain, x-rays did show patellofemoral arthritis, he had gained about 25 pounds. He has responded well to conservative treatment including home physical therapy, meloxicam. He is happy with how things are going so far, plans to continue the home physical therapy. I would like him to focus more on weight loss before we address his knee any further.

## 2022-12-29 NOTE — Assessment & Plan Note (Signed)
Please see prior notes, improved considerably, workup was unrevealing. He feels this more when he is anxious, suspect more irritable bowel syndrome versus overactive bladder, since he is doing so well I have given him some warning signs to look out for and he can follow this up with his PCP. We will watch this for now.

## 2022-12-29 NOTE — Progress Notes (Signed)
I would like him to focus more on weight loss before we address his   Procedures performed today:    None.  Independent interpretation of notes and tests performed by another provider:   None.  Brief History, Exam, Impression, and Recommendations:    Patellofemoral arthritis of right knee This pleasant 37 year old male returns, he started with patellofemoral type pain, x-rays did show patellofemoral arthritis, he had gained about 25 pounds. He has responded well to conservative treatment including home physical therapy, meloxicam. He is happy with how things are going so far, plans to continue the home physical therapy. I would like him to focus more on weight loss before we address his knee any further.   Colicky LLQ abdominal pain Please see prior notes, improved considerably, workup was unrevealing. He feels this more when he is anxious, suspect more irritable bowel syndrome versus overactive bladder, since he is doing so well I have given him some warning signs to look out for and he can follow this up with his PCP. We will watch this for now.  Class 1 obesity due to excess calories in adult Never heard back from [facility], I would like him to touch base with his PCP for discussion of GLP-1 treatment as well as dieting and exercising as an adjunct.    ____________________________________________ Benjamin Mendoza. Benjamin Stain, M.D., ABFM., CAQSM., AME. Primary Care and Sports Medicine [place] [facility]  Adjunct Professor of Family Medicine  [employer]  Restaurant manager, fast food

## 2023-02-16 DIAGNOSIS — M9901 Segmental and somatic dysfunction of cervical region: Secondary | ICD-10-CM | POA: Diagnosis not present

## 2023-02-16 DIAGNOSIS — M9904 Segmental and somatic dysfunction of sacral region: Secondary | ICD-10-CM | POA: Diagnosis not present

## 2023-02-16 DIAGNOSIS — M9905 Segmental and somatic dysfunction of pelvic region: Secondary | ICD-10-CM | POA: Diagnosis not present

## 2023-02-16 DIAGNOSIS — M5451 Vertebrogenic low back pain: Secondary | ICD-10-CM | POA: Diagnosis not present

## 2023-02-16 DIAGNOSIS — M9902 Segmental and somatic dysfunction of thoracic region: Secondary | ICD-10-CM | POA: Diagnosis not present

## 2023-02-16 DIAGNOSIS — M9903 Segmental and somatic dysfunction of lumbar region: Secondary | ICD-10-CM | POA: Diagnosis not present

## 2023-02-16 DIAGNOSIS — M542 Cervicalgia: Secondary | ICD-10-CM | POA: Diagnosis not present

## 2023-02-16 DIAGNOSIS — M9907 Segmental and somatic dysfunction of upper extremity: Secondary | ICD-10-CM | POA: Diagnosis not present

## 2023-02-16 DIAGNOSIS — M546 Pain in thoracic spine: Secondary | ICD-10-CM | POA: Diagnosis not present

## 2023-02-16 DIAGNOSIS — M7532 Calcific tendinitis of left shoulder: Secondary | ICD-10-CM | POA: Diagnosis not present

## 2023-02-19 DIAGNOSIS — Z79899 Other long term (current) drug therapy: Secondary | ICD-10-CM | POA: Diagnosis not present

## 2023-02-19 DIAGNOSIS — G43109 Migraine with aura, not intractable, without status migrainosus: Secondary | ICD-10-CM | POA: Diagnosis not present

## 2023-02-19 DIAGNOSIS — G40909 Epilepsy, unspecified, not intractable, without status epilepticus: Secondary | ICD-10-CM | POA: Diagnosis not present

## 2023-02-19 DIAGNOSIS — E559 Vitamin D deficiency, unspecified: Secondary | ICD-10-CM | POA: Diagnosis not present

## 2023-02-19 DIAGNOSIS — Z5181 Encounter for therapeutic drug level monitoring: Secondary | ICD-10-CM | POA: Diagnosis not present

## 2023-02-19 DIAGNOSIS — M542 Cervicalgia: Secondary | ICD-10-CM | POA: Diagnosis not present

## 2023-02-20 ENCOUNTER — Other Ambulatory Visit: Payer: Self-pay | Admitting: Family Medicine

## 2023-02-20 ENCOUNTER — Telehealth: Payer: Self-pay | Admitting: Family Medicine

## 2023-02-20 DIAGNOSIS — E782 Mixed hyperlipidemia: Secondary | ICD-10-CM

## 2023-02-20 NOTE — Telephone Encounter (Signed)
Patient called he is requesting refills on Rosuvastain 20mg  he has 3 days left Please submit to pharmacy  [facility] [address] [place] Phone [phone]

## 2023-02-21 MED ORDER — ROSUVASTATIN CALCIUM 20 MG PO TABS: 20.0000 mg | ORAL_TABLET | Freq: Every day | ORAL | 0 refills | Status: DC

## 2023-02-21 NOTE — Telephone Encounter (Signed)
Pls contact the patient to schedule appt with Dr. Ashley Royalty with fasting labs. Last seen by a PCP 01/2022. Sending 30 day refill. Thanks

## 2023-03-16 ENCOUNTER — Ambulatory Visit (INDEPENDENT_AMBULATORY_CARE_PROVIDER_SITE_OTHER): Payer: 59 | Admitting: Family Medicine

## 2023-03-16 VITALS — BP 121/80 | HR 80 | Ht 73.0 in | Wt 295.0 lb

## 2023-03-16 DIAGNOSIS — E782 Mixed hyperlipidemia: Secondary | ICD-10-CM

## 2023-03-16 DIAGNOSIS — Z23 Encounter for immunization: Secondary | ICD-10-CM | POA: Diagnosis not present

## 2023-03-16 DIAGNOSIS — G43109 Migraine with aura, not intractable, without status migrainosus: Secondary | ICD-10-CM | POA: Diagnosis not present

## 2023-03-16 MED ORDER — ROSUVASTATIN CALCIUM 20 MG PO TABS: 20.0000 mg | ORAL_TABLET | Freq: Every day | ORAL | 1 refills | Status: AC

## 2023-03-16 NOTE — Assessment & Plan Note (Signed)
Doing well with crestor.  Lab Results  Component Value Date   LDLCALC 63 11/17/2022

## 2023-03-16 NOTE — Progress Notes (Signed)
Benjamin Mendoza - 37 y.o. male MRN [id number]  Date of birth: 1985/11/26  Subjective Chief Complaint  Patient presents with   Medical Management of Chronic Issues    HPI Benjamin Mendoza is a 37 y.o. male here today for follow up visit.   He continues on crestor for management of lipids.  Also taking fish oil.  He is tolerating this well.  Prediabetes noted on labs from August.   Seeing neurology for migraines which is managed with topiramate and treximet for breakthrough episodes.   These are well controlled at this time.   ROS:  A comprehensive ROS was completed and negative except as noted per HPI  Allergies  Allergen Reactions   Metoclopramide Palpitations        Dexamethasone Other (See Comments)    Unsure    Past Medical History:  Diagnosis Date   Deviated nasal septum    Dyslipidemia 10/21/2014   GERD (gastroesophageal reflux disease)    IFG (impaired fasting glucose) 10/21/2014   Migraine with visual aura 10/21/2015   OSA (obstructive sleep apnea)    OSA on CPAP 07/22/2015   Overview:  Followed [facility]   Seizure disorder [facility])     Past Surgical History:  Procedure Laterality Date   TONSILLECTOMY      Social History   Socioeconomic History   Marital status: Married    Spouse name: Not on file   Number of children: Not on file   Years of education: Not on file   Highest education level: GED or equivalent  Occupational History    Comment: IT  Tobacco Use   Smoking status: Never   Smokeless tobacco: Never  Vaping Use   Vaping status: Never Used  Substance and Sexual Activity   Alcohol use: Yes    Alcohol/week: 4.0 standard drinks of alcohol    Types: 4 Standard drinks or equivalent per week   Drug use: Never   Sexual activity: Yes    Birth control/protection: None  Other Topics Concern   Not on file  Social History Narrative   Not on file   Social Determinants of Health   Financial Resource Strain: Low Risk  (03/16/2023)    Overall Financial Resource Strain (CARDIA)    Difficulty of Paying Living Expenses: Not hard at all  Food Insecurity: No Food Insecurity (03/16/2023)   Hunger Vital Sign    Worried About Running Out of Food in the Last Year: Never true    Ran Out of Food in the Last Year: Never true  Transportation Needs: No Transportation Needs (03/16/2023)   PRAPARE - Administrator, Civil Service (Medical): No    Lack of Transportation (Non-Medical): No  Physical Activity: Insufficiently Active (03/16/2023)   Exercise Vital Sign    Days of Exercise per Week: 1 day    Minutes of Exercise per Session: 30 min  Stress: No Stress Concern Present (03/16/2023)   [employer] of Occupational Health - Occupational Stress Questionnaire    Feeling of Stress : Only a little  Social Connections: Moderately Integrated (03/16/2023)   Social Connection and Isolation Panel [NHANES]    Frequency of Communication with Friends and Family: Once a week    Frequency of Social Gatherings with Friends and Family: Once a week    Attends Religious Services: More than 4 times per year    Active Member of [employer] or Organizations: Yes    Attends Banker Meetings: 1 to 4 times per year  Marital Status: Married    Family History  Problem Relation Age of Onset   Hypothyroidism Mother    Breast cancer Mother     Health Maintenance  Topic Date Due   HIV Screening  Never done   Hepatitis C Screening  Never done   COVID-19 Vaccine (1) 06/13/2023 (Originally 03/14/1990)   INFLUENZA VACCINE  07/09/2023 (Originally 11/09/2022)   DTaP/Tdap/Td (3 - Td or Tdap) 03/15/2033   HPV VACCINES  Aged Out     ----------------------------------------------------------------------------------------------------------------------------------------------------------------------------------------------------------------- Physical Exam BP 121/80 (BP Location: Left Arm, Patient Position: Sitting, Cuff Size: Large)    Pulse 80   Ht 6\' 1"  (1.854 m)   Wt 295 lb (133.8 kg)   SpO2 95%   BMI 38.92 kg/m   Physical Exam Constitutional:      Appearance: Normal appearance.  HENT:     Head: Normocephalic and atraumatic.  Cardiovascular:     Rate and Rhythm: Normal rate and regular rhythm.  Pulmonary:     Effort: Pulmonary effort is normal.     Breath sounds: Normal breath sounds.  Neurological:     General: No focal deficit present.     Mental Status: He is alert.  Psychiatric:        Mood and Affect: Mood normal.        Behavior: Behavior normal.     ------------------------------------------------------------------------------------------------------------------------------------------------------------------------------------------------------------------- Assessment and Plan  Migraine with visual aura Management per neurology.  He will continue topiramate with Treximet for breakthrough  Mixed hyperlipidemia Doing well with crestor.  Lab Results  Component Value Date   LDLCALC 63 11/17/2022     Meds ordered this encounter  Medications   rosuvastatin (CRESTOR) 20 MG tablet    Sig: Take 1 tablet (20 mg total) by mouth daily.    Dispense:  90 tablet    Refill:  1    Return in about 1 year (around 03/15/2024) for HLD.    This visit occurred during the SARS-CoV-2 public health emergency.  Safety protocols were in place, including screening questions prior to the visit, additional usage of staff PPE, and extensive cleaning of exam room while observing appropriate contact time as indicated for disinfecting solutions.

## 2023-03-16 NOTE — Assessment & Plan Note (Signed)
Management per neurology.  He will continue topiramate with Treximet for breakthrough

## 2023-03-23 DIAGNOSIS — E785 Hyperlipidemia, unspecified: Secondary | ICD-10-CM | POA: Diagnosis not present

## 2023-03-23 DIAGNOSIS — G40909 Epilepsy, unspecified, not intractable, without status epilepticus: Secondary | ICD-10-CM | POA: Diagnosis not present

## 2023-03-23 DIAGNOSIS — M1990 Unspecified osteoarthritis, unspecified site: Secondary | ICD-10-CM | POA: Diagnosis not present

## 2023-03-23 DIAGNOSIS — Z973 Presence of spectacles and contact lenses: Secondary | ICD-10-CM | POA: Diagnosis not present

## 2023-03-23 DIAGNOSIS — M62838 Other muscle spasm: Secondary | ICD-10-CM | POA: Diagnosis not present

## 2023-03-23 DIAGNOSIS — I10 Essential (primary) hypertension: Secondary | ICD-10-CM | POA: Diagnosis not present

## 2023-03-23 DIAGNOSIS — G43909 Migraine, unspecified, not intractable, without status migrainosus: Secondary | ICD-10-CM | POA: Diagnosis not present

## 2023-03-23 DIAGNOSIS — Z6838 Body mass index (BMI) 38.0-38.9, adult: Secondary | ICD-10-CM | POA: Diagnosis not present

## 2023-03-23 DIAGNOSIS — G4733 Obstructive sleep apnea (adult) (pediatric): Secondary | ICD-10-CM | POA: Diagnosis not present

## 2023-03-23 DIAGNOSIS — E559 Vitamin D deficiency, unspecified: Secondary | ICD-10-CM | POA: Diagnosis not present

## 2023-03-27 DIAGNOSIS — M546 Pain in thoracic spine: Secondary | ICD-10-CM | POA: Diagnosis not present

## 2023-03-27 DIAGNOSIS — M9901 Segmental and somatic dysfunction of cervical region: Secondary | ICD-10-CM | POA: Diagnosis not present

## 2023-03-27 DIAGNOSIS — M9907 Segmental and somatic dysfunction of upper extremity: Secondary | ICD-10-CM | POA: Diagnosis not present

## 2023-03-27 DIAGNOSIS — M9903 Segmental and somatic dysfunction of lumbar region: Secondary | ICD-10-CM | POA: Diagnosis not present

## 2023-03-27 DIAGNOSIS — M542 Cervicalgia: Secondary | ICD-10-CM | POA: Diagnosis not present

## 2023-03-27 DIAGNOSIS — M9902 Segmental and somatic dysfunction of thoracic region: Secondary | ICD-10-CM | POA: Diagnosis not present

## 2023-03-27 DIAGNOSIS — M7532 Calcific tendinitis of left shoulder: Secondary | ICD-10-CM | POA: Diagnosis not present

## 2023-03-27 DIAGNOSIS — M5451 Vertebrogenic low back pain: Secondary | ICD-10-CM | POA: Diagnosis not present

## 2023-03-27 DIAGNOSIS — M9905 Segmental and somatic dysfunction of pelvic region: Secondary | ICD-10-CM | POA: Diagnosis not present

## 2023-03-27 DIAGNOSIS — M9904 Segmental and somatic dysfunction of sacral region: Secondary | ICD-10-CM | POA: Diagnosis not present

## 2023-04-25 DIAGNOSIS — M9901 Segmental and somatic dysfunction of cervical region: Secondary | ICD-10-CM | POA: Diagnosis not present

## 2023-04-25 DIAGNOSIS — M9903 Segmental and somatic dysfunction of lumbar region: Secondary | ICD-10-CM | POA: Diagnosis not present

## 2023-04-25 DIAGNOSIS — M546 Pain in thoracic spine: Secondary | ICD-10-CM | POA: Diagnosis not present

## 2023-04-25 DIAGNOSIS — M9904 Segmental and somatic dysfunction of sacral region: Secondary | ICD-10-CM | POA: Diagnosis not present

## 2023-04-25 DIAGNOSIS — M2560 Stiffness of unspecified joint, not elsewhere classified: Secondary | ICD-10-CM | POA: Diagnosis not present

## 2023-04-25 DIAGNOSIS — M5451 Vertebrogenic low back pain: Secondary | ICD-10-CM | POA: Diagnosis not present

## 2023-04-25 DIAGNOSIS — M5386 Other specified dorsopathies, lumbar region: Secondary | ICD-10-CM | POA: Diagnosis not present

## 2023-04-25 DIAGNOSIS — M7532 Calcific tendinitis of left shoulder: Secondary | ICD-10-CM | POA: Diagnosis not present

## 2023-04-25 DIAGNOSIS — M9907 Segmental and somatic dysfunction of upper extremity: Secondary | ICD-10-CM | POA: Diagnosis not present

## 2023-04-25 DIAGNOSIS — M9905 Segmental and somatic dysfunction of pelvic region: Secondary | ICD-10-CM | POA: Diagnosis not present

## 2023-04-25 DIAGNOSIS — M542 Cervicalgia: Secondary | ICD-10-CM | POA: Diagnosis not present

## 2023-04-25 DIAGNOSIS — M9902 Segmental and somatic dysfunction of thoracic region: Secondary | ICD-10-CM | POA: Diagnosis not present

## 2023-04-27 DIAGNOSIS — M542 Cervicalgia: Secondary | ICD-10-CM | POA: Diagnosis not present

## 2023-04-27 DIAGNOSIS — M9904 Segmental and somatic dysfunction of sacral region: Secondary | ICD-10-CM | POA: Diagnosis not present

## 2023-04-27 DIAGNOSIS — M9903 Segmental and somatic dysfunction of lumbar region: Secondary | ICD-10-CM | POA: Diagnosis not present

## 2023-04-27 DIAGNOSIS — M9901 Segmental and somatic dysfunction of cervical region: Secondary | ICD-10-CM | POA: Diagnosis not present

## 2023-04-27 DIAGNOSIS — M9905 Segmental and somatic dysfunction of pelvic region: Secondary | ICD-10-CM | POA: Diagnosis not present

## 2023-04-27 DIAGNOSIS — M7532 Calcific tendinitis of left shoulder: Secondary | ICD-10-CM | POA: Diagnosis not present

## 2023-04-27 DIAGNOSIS — M9902 Segmental and somatic dysfunction of thoracic region: Secondary | ICD-10-CM | POA: Diagnosis not present

## 2023-04-27 DIAGNOSIS — M5386 Other specified dorsopathies, lumbar region: Secondary | ICD-10-CM | POA: Diagnosis not present

## 2023-04-27 DIAGNOSIS — M546 Pain in thoracic spine: Secondary | ICD-10-CM | POA: Diagnosis not present

## 2023-04-27 DIAGNOSIS — M5451 Vertebrogenic low back pain: Secondary | ICD-10-CM | POA: Diagnosis not present

## 2023-04-27 DIAGNOSIS — M2560 Stiffness of unspecified joint, not elsewhere classified: Secondary | ICD-10-CM | POA: Diagnosis not present

## 2023-04-27 DIAGNOSIS — M9907 Segmental and somatic dysfunction of upper extremity: Secondary | ICD-10-CM | POA: Diagnosis not present

## 2023-04-28 DIAGNOSIS — R1032 Left lower quadrant pain: Secondary | ICD-10-CM | POA: Diagnosis not present

## 2023-04-28 DIAGNOSIS — I10 Essential (primary) hypertension: Secondary | ICD-10-CM | POA: Diagnosis not present

## 2023-04-28 DIAGNOSIS — M545 Low back pain, unspecified: Secondary | ICD-10-CM | POA: Diagnosis not present

## 2023-04-28 DIAGNOSIS — N132 Hydronephrosis with renal and ureteral calculous obstruction: Secondary | ICD-10-CM | POA: Diagnosis not present

## 2023-04-28 DIAGNOSIS — N200 Calculus of kidney: Secondary | ICD-10-CM | POA: Diagnosis not present

## 2023-05-03 ENCOUNTER — Telehealth: Payer: Self-pay | Admitting: Family Medicine

## 2023-05-03 DIAGNOSIS — N201 Calculus of ureter: Secondary | ICD-10-CM

## 2023-05-03 NOTE — Telephone Encounter (Signed)
Copied from CRM [id number]. Topic: Referral - Request for Referral >> May 03, 2023  9:41 AM Elle L wrote: Did the patient discuss referral with their provider in the last year? No  Appointment offered? Yes, the patient requested just a referral if possible. I informed him that I would send a request to his provider but he may need to schedule an appointment.   Type of order/referral and detailed reason for visit: The hospital referred the patient to [facility] due to kidney stones. However, they cannot see him until March and advised him to request an urgent referral from his primary care provider.  Preference of office, provider, location: No preference. The patient states he is open to be referred to another practice if they can see him sooner.   If referral order, have you been seen by this specialty before? No  Can we respond through MyChart? Yes

## 2023-05-04 ENCOUNTER — Telehealth: Payer: Self-pay | Admitting: Family Medicine

## 2023-05-04 NOTE — Addendum Note (Signed)
Addended by: Mammie Lorenzo on: 05/04/2023 11:41 AM   Modules accepted: Orders

## 2023-05-04 NOTE — Telephone Encounter (Signed)
Copied from CRM [id number]. Topic: Referral - Request for Referral >> May 04, 2023 10:06 AM Dennison Nancy wrote: Did the patient discuss referral with their provider in the last year? No (If No - schedule appointment) (If Yes - send message)  Appointment offered? No  Type of order/referral and detailed reason for visit:  patient was seen in emergency room for kidney stone was in a lot of pain emergency room provider  referred the patient to [facility] due to kidney stones. However, they cannot see him until March and advised him to request an urgent referral from his primary care provider Patient requesting another referral for a urology to getting a sooner appointment   Preference of office, provider, location: n/a  If referral order, have you been seen by this specialty before? No (If Yes, this issue or another issue? When? Where?  Can we respond through MyChart? Yes

## 2023-05-09 ENCOUNTER — Ambulatory Visit: Payer: 59 | Admitting: Family Medicine

## 2023-05-09 ENCOUNTER — Encounter: Payer: Self-pay | Admitting: Family Medicine

## 2023-05-09 VITALS — BP 113/78 | HR 77 | Ht 73.0 in | Wt 285.0 lb

## 2023-05-09 DIAGNOSIS — N201 Calculus of ureter: Secondary | ICD-10-CM | POA: Insufficient documentation

## 2023-05-09 NOTE — Progress Notes (Signed)
Benjamin Mendoza - 39 y.o. male MRN [id number]  Date of birth: [birth year]  Subjective Chief Complaint  Patient presents with   Hospitalization Follow-up    HPI Benjamin Mendoza is a 38 y.o. male here today for follow up of ED visit.   Seen in ED with complaint of abdominal pain/flank pain.  CT abdomen/pelvis with partially obstructing UPJ stone.  Referred to urology.  He does not have an appt scheduled yet.  There is some concern that topiramate may have contributed to stone.  He is taking this due to history of seizures.  This is managed by neurology.   His pain is improved.  He has increased his fluid intake.  ROS:  A comprehensive ROS was completed and negative except as noted per HPI   Allergies  Allergen Reactions   Metoclopramide Palpitations        Dexamethasone Other (See Comments)    Unsure    Past Medical History:  Diagnosis Date   Deviated nasal septum    Dyslipidemia 10/21/2014   GERD (gastroesophageal reflux disease)    IFG (impaired fasting glucose) 10/21/2014   Migraine with visual aura 10/21/2015   OSA (obstructive sleep apnea)    OSA on CPAP 07/22/2015   Overview:  Followed [facility]   Seizure disorder [facility])     Past Surgical History:  Procedure Laterality Date   TONSILLECTOMY      Social History   Socioeconomic History   Marital status: Married    Spouse name: Not on file   Number of children: Not on file   Years of education: Not on file   Highest education level: GED or equivalent  Occupational History    Comment: IT  Tobacco Use   Smoking status: Never   Smokeless tobacco: Never  Vaping Use   Vaping status: Never Used  Substance and Sexual Activity   Alcohol use: Yes    Alcohol/week: 4.0 standard drinks of alcohol    Types: 4 Standard drinks or equivalent per week   Drug use: Never   Sexual activity: Yes    Birth control/protection: None  Other Topics Concern   Not on file  Social History Narrative   Not on file    Social Drivers of Health   Financial Resource Strain: Low Risk  (03/16/2023)   Overall Financial Resource Strain (CARDIA)    Difficulty of Paying Living Expenses: Not hard at all  Food Insecurity: No Food Insecurity (03/16/2023)   Hunger Vital Sign    Worried About Running Out of Food in the Last Year: Never true    Ran Out of Food in the Last Year: Never true  Transportation Needs: No Transportation Needs (03/16/2023)   PRAPARE - Administrator, Civil Service (Medical): No    Lack of Transportation (Non-Medical): No  Physical Activity: Insufficiently Active (03/16/2023)   Exercise Vital Sign    Days of Exercise per Week: 1 day    Minutes of Exercise per Session: 30 min  Stress: No Stress Concern Present (03/16/2023)   [employer] of Occupational Health - Occupational Stress Questionnaire    Feeling of Stress : Only a little  Social Connections: Moderately Integrated (03/16/2023)   Social Connection and Isolation Panel [NHANES]    Frequency of Communication with Friends and Family: Once a week    Frequency of Social Gatherings with Friends and Family: Once a week    Attends Religious Services: More than 4 times per year    Active Member of  Clubs or Organizations: Yes    Attends Banker Meetings: 1 to 4 times per year    Marital Status: Married    Family History  Problem Relation Age of Onset   Hypothyroidism Mother    Breast cancer Mother     Health Maintenance  Topic Date Due   HIV Screening  Never done   Hepatitis C Screening  Never done   COVID-19 Vaccine (1) 06/13/2023 (Originally 03/14/1990)   INFLUENZA VACCINE  07/09/2023 (Originally 11/09/2022)   DTaP/Tdap/Td (3 - Td or Tdap) 03/15/2033   HPV VACCINES  Aged Out     ----------------------------------------------------------------------------------------------------------------------------------------------------------------------------------------------------------------- Physical  Exam BP 113/78 (BP Location: Right Arm, Patient Position: Sitting, Cuff Size: Large)   Pulse 77   Ht 6\' 1"  (1.854 m)   Wt 285 lb (129.3 kg)   SpO2 97%   BMI 37.60 kg/m   Physical Exam Constitutional:      Appearance: Normal appearance.  Eyes:     General: No scleral icterus. Cardiovascular:     Rate and Rhythm: Normal rate and regular rhythm.  Pulmonary:     Effort: Pulmonary effort is normal.     Breath sounds: Normal breath sounds.  Neurological:     Mental Status: He is alert.  Psychiatric:        Mood and Affect: Mood normal.        Behavior: Behavior normal.     ------------------------------------------------------------------------------------------------------------------------------------------------------------------------------------------------------------------- Assessment and Plan  Ureteral stone He has ureteral stone with mild hydronephrosis.  Continue with increased fluids.  Urology referral has been entered, given contact info to facilitate scheduling.  He will discuss if he needs to change topiramate with this urologist.     No orders of the defined types were placed in this encounter.   No follow-ups on file.    This visit occurred during the SARS-CoV-2 public health emergency.  Safety protocols were in place, including screening questions prior to the visit, additional usage of staff PPE, and extensive cleaning of exam room while observing appropriate contact time as indicated for disinfecting solutions.

## 2023-05-09 NOTE — Assessment & Plan Note (Addendum)
He has ureteral stone with mild hydronephrosis.  Continue with increased fluids.  Urology referral has been entered, given contact info to facilitate scheduling.  He will discuss if he needs to change topiramate with this urologist.

## 2023-06-06 DIAGNOSIS — M542 Cervicalgia: Secondary | ICD-10-CM | POA: Diagnosis not present

## 2023-06-06 DIAGNOSIS — M9907 Segmental and somatic dysfunction of upper extremity: Secondary | ICD-10-CM | POA: Diagnosis not present

## 2023-06-06 DIAGNOSIS — M2560 Stiffness of unspecified joint, not elsewhere classified: Secondary | ICD-10-CM | POA: Diagnosis not present

## 2023-06-06 DIAGNOSIS — M7532 Calcific tendinitis of left shoulder: Secondary | ICD-10-CM | POA: Diagnosis not present

## 2023-06-06 DIAGNOSIS — M546 Pain in thoracic spine: Secondary | ICD-10-CM | POA: Diagnosis not present

## 2023-06-06 DIAGNOSIS — M5451 Vertebrogenic low back pain: Secondary | ICD-10-CM | POA: Diagnosis not present

## 2023-06-06 DIAGNOSIS — M9904 Segmental and somatic dysfunction of sacral region: Secondary | ICD-10-CM | POA: Diagnosis not present

## 2023-06-06 DIAGNOSIS — M9902 Segmental and somatic dysfunction of thoracic region: Secondary | ICD-10-CM | POA: Diagnosis not present

## 2023-06-06 DIAGNOSIS — M9905 Segmental and somatic dysfunction of pelvic region: Secondary | ICD-10-CM | POA: Diagnosis not present

## 2023-06-06 DIAGNOSIS — M9901 Segmental and somatic dysfunction of cervical region: Secondary | ICD-10-CM | POA: Diagnosis not present

## 2023-06-06 DIAGNOSIS — M9903 Segmental and somatic dysfunction of lumbar region: Secondary | ICD-10-CM | POA: Diagnosis not present

## 2023-06-06 DIAGNOSIS — M5386 Other specified dorsopathies, lumbar region: Secondary | ICD-10-CM | POA: Diagnosis not present

## 2023-06-18 DIAGNOSIS — R390 Extravasation of urine: Secondary | ICD-10-CM | POA: Diagnosis not present

## 2023-06-18 DIAGNOSIS — R399 Unspecified symptoms and signs involving the genitourinary system: Secondary | ICD-10-CM | POA: Diagnosis not present

## 2023-06-18 DIAGNOSIS — N130 Hydronephrosis with ureteropelvic junction obstruction: Secondary | ICD-10-CM | POA: Diagnosis not present

## 2023-06-18 DIAGNOSIS — N200 Calculus of kidney: Secondary | ICD-10-CM | POA: Diagnosis not present

## 2023-07-04 DIAGNOSIS — N202 Calculus of kidney with calculus of ureter: Secondary | ICD-10-CM | POA: Diagnosis not present

## 2023-12-11 ENCOUNTER — Encounter: Payer: Self-pay | Admitting: Sports Medicine

## 2023-12-31 DIAGNOSIS — M542 Cervicalgia: Secondary | ICD-10-CM | POA: Diagnosis not present

## 2023-12-31 DIAGNOSIS — M9902 Segmental and somatic dysfunction of thoracic region: Secondary | ICD-10-CM | POA: Diagnosis not present

## 2023-12-31 DIAGNOSIS — M2560 Stiffness of unspecified joint, not elsewhere classified: Secondary | ICD-10-CM | POA: Diagnosis not present

## 2023-12-31 DIAGNOSIS — M9907 Segmental and somatic dysfunction of upper extremity: Secondary | ICD-10-CM | POA: Diagnosis not present

## 2023-12-31 DIAGNOSIS — M5451 Vertebrogenic low back pain: Secondary | ICD-10-CM | POA: Diagnosis not present

## 2023-12-31 DIAGNOSIS — M5386 Other specified dorsopathies, lumbar region: Secondary | ICD-10-CM | POA: Diagnosis not present

## 2023-12-31 DIAGNOSIS — M9904 Segmental and somatic dysfunction of sacral region: Secondary | ICD-10-CM | POA: Diagnosis not present

## 2023-12-31 DIAGNOSIS — M9901 Segmental and somatic dysfunction of cervical region: Secondary | ICD-10-CM | POA: Diagnosis not present

## 2023-12-31 DIAGNOSIS — M7532 Calcific tendinitis of left shoulder: Secondary | ICD-10-CM | POA: Diagnosis not present

## 2023-12-31 DIAGNOSIS — M9905 Segmental and somatic dysfunction of pelvic region: Secondary | ICD-10-CM | POA: Diagnosis not present

## 2023-12-31 DIAGNOSIS — M546 Pain in thoracic spine: Secondary | ICD-10-CM | POA: Diagnosis not present

## 2023-12-31 DIAGNOSIS — M9903 Segmental and somatic dysfunction of lumbar region: Secondary | ICD-10-CM | POA: Diagnosis not present

## 2024-01-02 DIAGNOSIS — M7532 Calcific tendinitis of left shoulder: Secondary | ICD-10-CM | POA: Diagnosis not present

## 2024-01-02 DIAGNOSIS — M9902 Segmental and somatic dysfunction of thoracic region: Secondary | ICD-10-CM | POA: Diagnosis not present

## 2024-01-02 DIAGNOSIS — M9903 Segmental and somatic dysfunction of lumbar region: Secondary | ICD-10-CM | POA: Diagnosis not present

## 2024-01-02 DIAGNOSIS — M546 Pain in thoracic spine: Secondary | ICD-10-CM | POA: Diagnosis not present

## 2024-01-02 DIAGNOSIS — M5451 Vertebrogenic low back pain: Secondary | ICD-10-CM | POA: Diagnosis not present

## 2024-01-02 DIAGNOSIS — M5386 Other specified dorsopathies, lumbar region: Secondary | ICD-10-CM | POA: Diagnosis not present

## 2024-01-02 DIAGNOSIS — M9905 Segmental and somatic dysfunction of pelvic region: Secondary | ICD-10-CM | POA: Diagnosis not present

## 2024-01-02 DIAGNOSIS — M9901 Segmental and somatic dysfunction of cervical region: Secondary | ICD-10-CM | POA: Diagnosis not present

## 2024-01-02 DIAGNOSIS — M2560 Stiffness of unspecified joint, not elsewhere classified: Secondary | ICD-10-CM | POA: Diagnosis not present

## 2024-01-02 DIAGNOSIS — M542 Cervicalgia: Secondary | ICD-10-CM | POA: Diagnosis not present

## 2024-01-02 DIAGNOSIS — M9904 Segmental and somatic dysfunction of sacral region: Secondary | ICD-10-CM | POA: Diagnosis not present

## 2024-01-02 DIAGNOSIS — M9907 Segmental and somatic dysfunction of upper extremity: Secondary | ICD-10-CM | POA: Diagnosis not present

## 2024-01-04 DIAGNOSIS — M9903 Segmental and somatic dysfunction of lumbar region: Secondary | ICD-10-CM | POA: Diagnosis not present

## 2024-01-04 DIAGNOSIS — M5451 Vertebrogenic low back pain: Secondary | ICD-10-CM | POA: Diagnosis not present

## 2024-01-04 DIAGNOSIS — M9901 Segmental and somatic dysfunction of cervical region: Secondary | ICD-10-CM | POA: Diagnosis not present

## 2024-01-04 DIAGNOSIS — M542 Cervicalgia: Secondary | ICD-10-CM | POA: Diagnosis not present

## 2024-01-04 DIAGNOSIS — M9904 Segmental and somatic dysfunction of sacral region: Secondary | ICD-10-CM | POA: Diagnosis not present

## 2024-01-04 DIAGNOSIS — M9907 Segmental and somatic dysfunction of upper extremity: Secondary | ICD-10-CM | POA: Diagnosis not present

## 2024-01-04 DIAGNOSIS — M5386 Other specified dorsopathies, lumbar region: Secondary | ICD-10-CM | POA: Diagnosis not present

## 2024-01-04 DIAGNOSIS — M7532 Calcific tendinitis of left shoulder: Secondary | ICD-10-CM | POA: Diagnosis not present

## 2024-01-04 DIAGNOSIS — M2560 Stiffness of unspecified joint, not elsewhere classified: Secondary | ICD-10-CM | POA: Diagnosis not present

## 2024-01-04 DIAGNOSIS — M9905 Segmental and somatic dysfunction of pelvic region: Secondary | ICD-10-CM | POA: Diagnosis not present

## 2024-01-04 DIAGNOSIS — M546 Pain in thoracic spine: Secondary | ICD-10-CM | POA: Diagnosis not present

## 2024-01-04 DIAGNOSIS — M9902 Segmental and somatic dysfunction of thoracic region: Secondary | ICD-10-CM | POA: Diagnosis not present

## 2024-01-14 DIAGNOSIS — M7532 Calcific tendinitis of left shoulder: Secondary | ICD-10-CM | POA: Diagnosis not present

## 2024-01-14 DIAGNOSIS — M542 Cervicalgia: Secondary | ICD-10-CM | POA: Diagnosis not present

## 2024-01-14 DIAGNOSIS — M9903 Segmental and somatic dysfunction of lumbar region: Secondary | ICD-10-CM | POA: Diagnosis not present

## 2024-01-14 DIAGNOSIS — M9904 Segmental and somatic dysfunction of sacral region: Secondary | ICD-10-CM | POA: Diagnosis not present

## 2024-01-14 DIAGNOSIS — M546 Pain in thoracic spine: Secondary | ICD-10-CM | POA: Diagnosis not present

## 2024-01-14 DIAGNOSIS — M9907 Segmental and somatic dysfunction of upper extremity: Secondary | ICD-10-CM | POA: Diagnosis not present

## 2024-01-14 DIAGNOSIS — M9905 Segmental and somatic dysfunction of pelvic region: Secondary | ICD-10-CM | POA: Diagnosis not present

## 2024-01-14 DIAGNOSIS — M5451 Vertebrogenic low back pain: Secondary | ICD-10-CM | POA: Diagnosis not present

## 2024-01-14 DIAGNOSIS — M9901 Segmental and somatic dysfunction of cervical region: Secondary | ICD-10-CM | POA: Diagnosis not present

## 2024-01-14 DIAGNOSIS — M5386 Other specified dorsopathies, lumbar region: Secondary | ICD-10-CM | POA: Diagnosis not present

## 2024-01-14 DIAGNOSIS — M9902 Segmental and somatic dysfunction of thoracic region: Secondary | ICD-10-CM | POA: Diagnosis not present

## 2024-01-14 DIAGNOSIS — M2560 Stiffness of unspecified joint, not elsewhere classified: Secondary | ICD-10-CM | POA: Diagnosis not present

## 2024-02-04 DIAGNOSIS — N200 Calculus of kidney: Secondary | ICD-10-CM | POA: Diagnosis not present

## 2024-02-11 DIAGNOSIS — N132 Hydronephrosis with renal and ureteral calculous obstruction: Secondary | ICD-10-CM | POA: Diagnosis not present

## 2024-03-14 ENCOUNTER — Encounter: Payer: 59 | Admitting: Family Medicine

## 2024-03-14 DIAGNOSIS — N200 Calculus of kidney: Secondary | ICD-10-CM | POA: Diagnosis not present

## 2024-03-17 DIAGNOSIS — N201 Calculus of ureter: Secondary | ICD-10-CM | POA: Diagnosis not present
